# Patient Record
Sex: Male | Born: 1945 | Race: White | Hispanic: No | Marital: Married | Smoking: Former smoker
Health system: Southern US, Community
[De-identification: ages and names within clinical notes are randomized; demographics above are authoritative.]

## PROBLEM LIST (undated history)

## (undated) DIAGNOSIS — I10 Essential (primary) hypertension: Secondary | ICD-10-CM

## (undated) DIAGNOSIS — R112 Nausea with vomiting, unspecified: Secondary | ICD-10-CM

## (undated) DIAGNOSIS — E782 Mixed hyperlipidemia: Secondary | ICD-10-CM

## (undated) DIAGNOSIS — G8929 Other chronic pain: Secondary | ICD-10-CM

## (undated) DIAGNOSIS — C801 Malignant (primary) neoplasm, unspecified: Secondary | ICD-10-CM

## (undated) DIAGNOSIS — R609 Edema, unspecified: Secondary | ICD-10-CM

## (undated) DIAGNOSIS — Z9889 Other specified postprocedural states: Secondary | ICD-10-CM

## (undated) DIAGNOSIS — I5189 Other ill-defined heart diseases: Secondary | ICD-10-CM

## (undated) DIAGNOSIS — M1990 Unspecified osteoarthritis, unspecified site: Secondary | ICD-10-CM

## (undated) DIAGNOSIS — R972 Elevated prostate specific antigen [PSA]: Secondary | ICD-10-CM

## (undated) HISTORY — DX: Edema, unspecified: R60.9

## (undated) HISTORY — DX: Other chronic pain: G89.29

## (undated) HISTORY — DX: Other ill-defined heart diseases: I51.89

## (undated) HISTORY — PX: PROSTATECTOMY: SHX69

## (undated) HISTORY — PX: TOTAL HIP ARTHROPLASTY: SHX124

## (undated) HISTORY — DX: Elevated prostate specific antigen (PSA): R97.20

## (undated) HISTORY — DX: Mixed hyperlipidemia: E78.2

---

## 2011-07-05 DIAGNOSIS — N529 Male erectile dysfunction, unspecified: Secondary | ICD-10-CM | POA: Diagnosis not present

## 2011-07-05 DIAGNOSIS — C61 Malignant neoplasm of prostate: Secondary | ICD-10-CM | POA: Diagnosis not present

## 2011-07-19 DIAGNOSIS — L2089 Other atopic dermatitis: Secondary | ICD-10-CM | POA: Diagnosis not present

## 2011-07-19 DIAGNOSIS — E782 Mixed hyperlipidemia: Secondary | ICD-10-CM | POA: Diagnosis not present

## 2011-07-19 DIAGNOSIS — E119 Type 2 diabetes mellitus without complications: Secondary | ICD-10-CM | POA: Diagnosis not present

## 2011-07-19 DIAGNOSIS — I10 Essential (primary) hypertension: Secondary | ICD-10-CM | POA: Diagnosis not present

## 2011-09-06 DIAGNOSIS — R5381 Other malaise: Secondary | ICD-10-CM | POA: Diagnosis not present

## 2011-09-06 DIAGNOSIS — J309 Allergic rhinitis, unspecified: Secondary | ICD-10-CM | POA: Diagnosis not present

## 2011-09-06 DIAGNOSIS — G4710 Hypersomnia, unspecified: Secondary | ICD-10-CM | POA: Diagnosis not present

## 2011-09-06 DIAGNOSIS — G4730 Sleep apnea, unspecified: Secondary | ICD-10-CM | POA: Diagnosis not present

## 2011-09-12 DIAGNOSIS — G4730 Sleep apnea, unspecified: Secondary | ICD-10-CM | POA: Diagnosis not present

## 2011-09-12 DIAGNOSIS — G4710 Hypersomnia, unspecified: Secondary | ICD-10-CM | POA: Diagnosis not present

## 2011-09-27 DIAGNOSIS — J309 Allergic rhinitis, unspecified: Secondary | ICD-10-CM | POA: Diagnosis not present

## 2011-09-27 DIAGNOSIS — G4710 Hypersomnia, unspecified: Secondary | ICD-10-CM | POA: Diagnosis not present

## 2011-09-27 DIAGNOSIS — I10 Essential (primary) hypertension: Secondary | ICD-10-CM | POA: Diagnosis not present

## 2011-09-28 DIAGNOSIS — G4730 Sleep apnea, unspecified: Secondary | ICD-10-CM | POA: Diagnosis not present

## 2011-09-28 DIAGNOSIS — G4710 Hypersomnia, unspecified: Secondary | ICD-10-CM | POA: Diagnosis not present

## 2011-10-04 DIAGNOSIS — R972 Elevated prostate specific antigen [PSA]: Secondary | ICD-10-CM | POA: Diagnosis not present

## 2011-10-04 DIAGNOSIS — J309 Allergic rhinitis, unspecified: Secondary | ICD-10-CM | POA: Diagnosis not present

## 2011-10-04 DIAGNOSIS — G4730 Sleep apnea, unspecified: Secondary | ICD-10-CM | POA: Diagnosis not present

## 2011-10-04 DIAGNOSIS — G4710 Hypersomnia, unspecified: Secondary | ICD-10-CM | POA: Diagnosis not present

## 2011-10-04 DIAGNOSIS — N529 Male erectile dysfunction, unspecified: Secondary | ICD-10-CM | POA: Diagnosis not present

## 2011-10-04 DIAGNOSIS — R32 Unspecified urinary incontinence: Secondary | ICD-10-CM | POA: Diagnosis not present

## 2011-10-04 DIAGNOSIS — C61 Malignant neoplasm of prostate: Secondary | ICD-10-CM | POA: Diagnosis not present

## 2012-01-17 DIAGNOSIS — E119 Type 2 diabetes mellitus without complications: Secondary | ICD-10-CM | POA: Diagnosis not present

## 2012-01-17 DIAGNOSIS — Z111 Encounter for screening for respiratory tuberculosis: Secondary | ICD-10-CM | POA: Diagnosis not present

## 2012-01-17 DIAGNOSIS — E782 Mixed hyperlipidemia: Secondary | ICD-10-CM | POA: Diagnosis not present

## 2012-01-17 DIAGNOSIS — I10 Essential (primary) hypertension: Secondary | ICD-10-CM | POA: Diagnosis not present

## 2012-02-07 DIAGNOSIS — C61 Malignant neoplasm of prostate: Secondary | ICD-10-CM | POA: Diagnosis not present

## 2012-02-07 DIAGNOSIS — N529 Male erectile dysfunction, unspecified: Secondary | ICD-10-CM | POA: Diagnosis not present

## 2012-03-16 DIAGNOSIS — Z23 Encounter for immunization: Secondary | ICD-10-CM | POA: Diagnosis not present

## 2012-03-25 DIAGNOSIS — Z23 Encounter for immunization: Secondary | ICD-10-CM | POA: Diagnosis not present

## 2012-03-27 DIAGNOSIS — E291 Testicular hypofunction: Secondary | ICD-10-CM | POA: Diagnosis not present

## 2012-03-27 DIAGNOSIS — N529 Male erectile dysfunction, unspecified: Secondary | ICD-10-CM | POA: Diagnosis not present

## 2012-06-12 DIAGNOSIS — N529 Male erectile dysfunction, unspecified: Secondary | ICD-10-CM | POA: Diagnosis not present

## 2012-06-12 DIAGNOSIS — C61 Malignant neoplasm of prostate: Secondary | ICD-10-CM | POA: Diagnosis not present

## 2012-06-12 DIAGNOSIS — N393 Stress incontinence (female) (male): Secondary | ICD-10-CM | POA: Diagnosis not present

## 2012-07-24 DIAGNOSIS — E782 Mixed hyperlipidemia: Secondary | ICD-10-CM | POA: Diagnosis not present

## 2012-07-24 DIAGNOSIS — E119 Type 2 diabetes mellitus without complications: Secondary | ICD-10-CM | POA: Diagnosis not present

## 2012-07-24 DIAGNOSIS — I10 Essential (primary) hypertension: Secondary | ICD-10-CM | POA: Diagnosis not present

## 2012-08-14 DIAGNOSIS — E291 Testicular hypofunction: Secondary | ICD-10-CM | POA: Diagnosis not present

## 2012-08-14 DIAGNOSIS — C61 Malignant neoplasm of prostate: Secondary | ICD-10-CM | POA: Diagnosis not present

## 2012-08-19 DIAGNOSIS — K5730 Diverticulosis of large intestine without perforation or abscess without bleeding: Secondary | ICD-10-CM | POA: Diagnosis not present

## 2012-08-19 DIAGNOSIS — Z86010 Personal history of colonic polyps: Secondary | ICD-10-CM | POA: Diagnosis not present

## 2012-08-19 DIAGNOSIS — Z800 Family history of malignant neoplasm of digestive organs: Secondary | ICD-10-CM | POA: Diagnosis not present

## 2012-08-21 DIAGNOSIS — E782 Mixed hyperlipidemia: Secondary | ICD-10-CM | POA: Diagnosis not present

## 2012-08-21 DIAGNOSIS — E119 Type 2 diabetes mellitus without complications: Secondary | ICD-10-CM | POA: Diagnosis not present

## 2012-10-16 DIAGNOSIS — C61 Malignant neoplasm of prostate: Secondary | ICD-10-CM | POA: Diagnosis not present

## 2012-10-16 DIAGNOSIS — N529 Male erectile dysfunction, unspecified: Secondary | ICD-10-CM | POA: Diagnosis not present

## 2012-11-18 DIAGNOSIS — M5380 Other specified dorsopathies, site unspecified: Secondary | ICD-10-CM | POA: Diagnosis not present

## 2012-11-18 DIAGNOSIS — M999 Biomechanical lesion, unspecified: Secondary | ICD-10-CM | POA: Diagnosis not present

## 2012-12-11 DIAGNOSIS — E782 Mixed hyperlipidemia: Secondary | ICD-10-CM | POA: Diagnosis not present

## 2012-12-11 DIAGNOSIS — E559 Vitamin D deficiency, unspecified: Secondary | ICD-10-CM | POA: Diagnosis not present

## 2012-12-11 DIAGNOSIS — E119 Type 2 diabetes mellitus without complications: Secondary | ICD-10-CM | POA: Diagnosis not present

## 2012-12-11 DIAGNOSIS — I10 Essential (primary) hypertension: Secondary | ICD-10-CM | POA: Diagnosis not present

## 2013-01-01 DIAGNOSIS — Z1331 Encounter for screening for depression: Secondary | ICD-10-CM | POA: Diagnosis not present

## 2013-01-01 DIAGNOSIS — I10 Essential (primary) hypertension: Secondary | ICD-10-CM | POA: Diagnosis not present

## 2013-01-01 DIAGNOSIS — IMO0001 Reserved for inherently not codable concepts without codable children: Secondary | ICD-10-CM | POA: Diagnosis not present

## 2013-01-01 DIAGNOSIS — E119 Type 2 diabetes mellitus without complications: Secondary | ICD-10-CM | POA: Diagnosis not present

## 2013-01-01 DIAGNOSIS — E782 Mixed hyperlipidemia: Secondary | ICD-10-CM | POA: Diagnosis not present

## 2013-03-05 DIAGNOSIS — C61 Malignant neoplasm of prostate: Secondary | ICD-10-CM | POA: Diagnosis not present

## 2013-03-05 DIAGNOSIS — N529 Male erectile dysfunction, unspecified: Secondary | ICD-10-CM | POA: Diagnosis not present

## 2013-03-26 DIAGNOSIS — H2510 Age-related nuclear cataract, unspecified eye: Secondary | ICD-10-CM | POA: Diagnosis not present

## 2013-03-26 DIAGNOSIS — E119 Type 2 diabetes mellitus without complications: Secondary | ICD-10-CM | POA: Diagnosis not present

## 2013-03-26 DIAGNOSIS — H43819 Vitreous degeneration, unspecified eye: Secondary | ICD-10-CM | POA: Diagnosis not present

## 2013-04-07 DIAGNOSIS — Z23 Encounter for immunization: Secondary | ICD-10-CM | POA: Diagnosis not present

## 2013-07-09 DIAGNOSIS — N529 Male erectile dysfunction, unspecified: Secondary | ICD-10-CM | POA: Diagnosis not present

## 2013-07-09 DIAGNOSIS — C61 Malignant neoplasm of prostate: Secondary | ICD-10-CM | POA: Diagnosis not present

## 2013-09-24 DIAGNOSIS — M999 Biomechanical lesion, unspecified: Secondary | ICD-10-CM | POA: Diagnosis not present

## 2013-09-24 DIAGNOSIS — M5380 Other specified dorsopathies, site unspecified: Secondary | ICD-10-CM | POA: Diagnosis not present

## 2013-09-30 DIAGNOSIS — Z471 Aftercare following joint replacement surgery: Secondary | ICD-10-CM | POA: Diagnosis not present

## 2013-09-30 DIAGNOSIS — M76899 Other specified enthesopathies of unspecified lower limb, excluding foot: Secondary | ICD-10-CM | POA: Diagnosis not present

## 2013-09-30 DIAGNOSIS — Z96649 Presence of unspecified artificial hip joint: Secondary | ICD-10-CM | POA: Diagnosis not present

## 2013-11-12 DIAGNOSIS — N529 Male erectile dysfunction, unspecified: Secondary | ICD-10-CM | POA: Diagnosis not present

## 2013-11-12 DIAGNOSIS — C61 Malignant neoplasm of prostate: Secondary | ICD-10-CM | POA: Diagnosis not present

## 2013-12-28 DIAGNOSIS — Z8546 Personal history of malignant neoplasm of prostate: Secondary | ICD-10-CM | POA: Diagnosis not present

## 2013-12-28 DIAGNOSIS — Z0389 Encounter for observation for other suspected diseases and conditions ruled out: Secondary | ICD-10-CM | POA: Diagnosis not present

## 2013-12-28 DIAGNOSIS — R079 Chest pain, unspecified: Secondary | ICD-10-CM | POA: Diagnosis not present

## 2013-12-28 DIAGNOSIS — Z7982 Long term (current) use of aspirin: Secondary | ICD-10-CM | POA: Diagnosis not present

## 2013-12-28 DIAGNOSIS — E785 Hyperlipidemia, unspecified: Secondary | ICD-10-CM | POA: Diagnosis not present

## 2013-12-28 DIAGNOSIS — R110 Nausea: Secondary | ICD-10-CM | POA: Diagnosis not present

## 2013-12-28 DIAGNOSIS — E782 Mixed hyperlipidemia: Secondary | ICD-10-CM | POA: Diagnosis not present

## 2013-12-28 DIAGNOSIS — Z87891 Personal history of nicotine dependence: Secondary | ICD-10-CM | POA: Diagnosis not present

## 2013-12-28 DIAGNOSIS — R072 Precordial pain: Secondary | ICD-10-CM | POA: Diagnosis not present

## 2013-12-28 DIAGNOSIS — Z79899 Other long term (current) drug therapy: Secondary | ICD-10-CM | POA: Diagnosis not present

## 2013-12-28 DIAGNOSIS — I10 Essential (primary) hypertension: Secondary | ICD-10-CM | POA: Diagnosis not present

## 2013-12-28 DIAGNOSIS — E119 Type 2 diabetes mellitus without complications: Secondary | ICD-10-CM | POA: Diagnosis not present

## 2013-12-28 DIAGNOSIS — I200 Unstable angina: Secondary | ICD-10-CM | POA: Diagnosis not present

## 2013-12-29 DIAGNOSIS — R0789 Other chest pain: Secondary | ICD-10-CM | POA: Diagnosis not present

## 2013-12-29 DIAGNOSIS — Z0389 Encounter for observation for other suspected diseases and conditions ruled out: Secondary | ICD-10-CM | POA: Diagnosis not present

## 2014-01-14 DIAGNOSIS — I10 Essential (primary) hypertension: Secondary | ICD-10-CM | POA: Diagnosis not present

## 2014-01-14 DIAGNOSIS — R5383 Other fatigue: Secondary | ICD-10-CM | POA: Diagnosis not present

## 2014-01-14 DIAGNOSIS — Z139 Encounter for screening, unspecified: Secondary | ICD-10-CM | POA: Diagnosis not present

## 2014-01-14 DIAGNOSIS — E782 Mixed hyperlipidemia: Secondary | ICD-10-CM | POA: Diagnosis not present

## 2014-01-14 DIAGNOSIS — M546 Pain in thoracic spine: Secondary | ICD-10-CM | POA: Diagnosis not present

## 2014-01-14 DIAGNOSIS — R5381 Other malaise: Secondary | ICD-10-CM | POA: Diagnosis not present

## 2014-01-14 DIAGNOSIS — IMO0002 Reserved for concepts with insufficient information to code with codable children: Secondary | ICD-10-CM | POA: Diagnosis not present

## 2014-01-14 DIAGNOSIS — E119 Type 2 diabetes mellitus without complications: Secondary | ICD-10-CM | POA: Diagnosis not present

## 2014-01-28 DIAGNOSIS — T8489XA Other specified complication of internal orthopedic prosthetic devices, implants and grafts, initial encounter: Secondary | ICD-10-CM | POA: Diagnosis not present

## 2014-02-04 DIAGNOSIS — M25559 Pain in unspecified hip: Secondary | ICD-10-CM | POA: Diagnosis not present

## 2014-03-03 DIAGNOSIS — Z23 Encounter for immunization: Secondary | ICD-10-CM | POA: Diagnosis not present

## 2014-03-16 DIAGNOSIS — Z23 Encounter for immunization: Secondary | ICD-10-CM | POA: Diagnosis not present

## 2014-03-25 DIAGNOSIS — M25552 Pain in left hip: Secondary | ICD-10-CM | POA: Diagnosis not present

## 2014-04-01 DIAGNOSIS — G4733 Obstructive sleep apnea (adult) (pediatric): Secondary | ICD-10-CM | POA: Diagnosis not present

## 2014-04-01 DIAGNOSIS — T562X1A Toxic effect of chromium and its compounds, accidental (unintentional), initial encounter: Secondary | ICD-10-CM | POA: Diagnosis not present

## 2014-04-01 DIAGNOSIS — Y793 Surgical instruments, materials and orthopedic devices (including sutures) associated with adverse incidents: Secondary | ICD-10-CM | POA: Diagnosis not present

## 2014-04-01 DIAGNOSIS — T8484XA Pain due to internal orthopedic prosthetic devices, implants and grafts, initial encounter: Secondary | ICD-10-CM | POA: Diagnosis not present

## 2014-04-01 DIAGNOSIS — Z8546 Personal history of malignant neoplasm of prostate: Secondary | ICD-10-CM | POA: Diagnosis not present

## 2014-04-01 DIAGNOSIS — Z96642 Presence of left artificial hip joint: Secondary | ICD-10-CM | POA: Diagnosis not present

## 2014-04-01 DIAGNOSIS — I10 Essential (primary) hypertension: Secondary | ICD-10-CM | POA: Diagnosis not present

## 2014-04-01 DIAGNOSIS — T8489XA Other specified complication of internal orthopedic prosthetic devices, implants and grafts, initial encounter: Secondary | ICD-10-CM | POA: Diagnosis not present

## 2014-04-06 DIAGNOSIS — T562X1A Toxic effect of chromium and its compounds, accidental (unintentional), initial encounter: Secondary | ICD-10-CM | POA: Diagnosis not present

## 2014-04-06 DIAGNOSIS — T84091A Other mechanical complication of internal left hip prosthesis, initial encounter: Secondary | ICD-10-CM | POA: Diagnosis not present

## 2014-04-06 DIAGNOSIS — T8484XA Pain due to internal orthopedic prosthetic devices, implants and grafts, initial encounter: Secondary | ICD-10-CM | POA: Diagnosis not present

## 2014-04-06 DIAGNOSIS — I10 Essential (primary) hypertension: Secondary | ICD-10-CM | POA: Diagnosis present

## 2014-04-06 DIAGNOSIS — M25552 Pain in left hip: Secondary | ICD-10-CM | POA: Diagnosis not present

## 2014-04-06 DIAGNOSIS — Z8546 Personal history of malignant neoplasm of prostate: Secondary | ICD-10-CM | POA: Diagnosis not present

## 2014-04-06 DIAGNOSIS — T562X4A Toxic effect of chromium and its compounds, undetermined, initial encounter: Secondary | ICD-10-CM | POA: Diagnosis not present

## 2014-04-06 DIAGNOSIS — Z96642 Presence of left artificial hip joint: Secondary | ICD-10-CM | POA: Diagnosis not present

## 2014-04-06 DIAGNOSIS — E119 Type 2 diabetes mellitus without complications: Secondary | ICD-10-CM | POA: Diagnosis present

## 2014-04-06 DIAGNOSIS — G8918 Other acute postprocedural pain: Secondary | ICD-10-CM | POA: Diagnosis not present

## 2014-04-06 DIAGNOSIS — Z87891 Personal history of nicotine dependence: Secondary | ICD-10-CM | POA: Diagnosis not present

## 2014-04-06 DIAGNOSIS — E785 Hyperlipidemia, unspecified: Secondary | ICD-10-CM | POA: Diagnosis present

## 2014-04-06 DIAGNOSIS — T8489XA Other specified complication of internal orthopedic prosthetic devices, implants and grafts, initial encounter: Secondary | ICD-10-CM | POA: Diagnosis present

## 2014-04-06 DIAGNOSIS — G4733 Obstructive sleep apnea (adult) (pediatric): Secondary | ICD-10-CM | POA: Diagnosis present

## 2014-04-10 DIAGNOSIS — Z96642 Presence of left artificial hip joint: Secondary | ICD-10-CM | POA: Diagnosis not present

## 2014-04-10 DIAGNOSIS — Z471 Aftercare following joint replacement surgery: Secondary | ICD-10-CM | POA: Diagnosis not present

## 2014-04-10 DIAGNOSIS — Z7901 Long term (current) use of anticoagulants: Secondary | ICD-10-CM | POA: Diagnosis not present

## 2014-04-10 DIAGNOSIS — Z5181 Encounter for therapeutic drug level monitoring: Secondary | ICD-10-CM | POA: Diagnosis not present

## 2014-04-11 DIAGNOSIS — Z5181 Encounter for therapeutic drug level monitoring: Secondary | ICD-10-CM | POA: Diagnosis not present

## 2014-04-11 DIAGNOSIS — Z96642 Presence of left artificial hip joint: Secondary | ICD-10-CM | POA: Diagnosis not present

## 2014-04-11 DIAGNOSIS — Z7901 Long term (current) use of anticoagulants: Secondary | ICD-10-CM | POA: Diagnosis not present

## 2014-04-11 DIAGNOSIS — Z471 Aftercare following joint replacement surgery: Secondary | ICD-10-CM | POA: Diagnosis not present

## 2014-04-13 DIAGNOSIS — Z5181 Encounter for therapeutic drug level monitoring: Secondary | ICD-10-CM | POA: Diagnosis not present

## 2014-04-13 DIAGNOSIS — Z7901 Long term (current) use of anticoagulants: Secondary | ICD-10-CM | POA: Diagnosis not present

## 2014-04-13 DIAGNOSIS — Z471 Aftercare following joint replacement surgery: Secondary | ICD-10-CM | POA: Diagnosis not present

## 2014-04-13 DIAGNOSIS — Z96642 Presence of left artificial hip joint: Secondary | ICD-10-CM | POA: Diagnosis not present

## 2014-04-14 DIAGNOSIS — Z471 Aftercare following joint replacement surgery: Secondary | ICD-10-CM | POA: Diagnosis not present

## 2014-04-14 DIAGNOSIS — Z96642 Presence of left artificial hip joint: Secondary | ICD-10-CM | POA: Diagnosis not present

## 2014-04-14 DIAGNOSIS — Z7901 Long term (current) use of anticoagulants: Secondary | ICD-10-CM | POA: Diagnosis not present

## 2014-04-14 DIAGNOSIS — Z5181 Encounter for therapeutic drug level monitoring: Secondary | ICD-10-CM | POA: Diagnosis not present

## 2014-04-18 DIAGNOSIS — Z7901 Long term (current) use of anticoagulants: Secondary | ICD-10-CM | POA: Diagnosis not present

## 2014-04-18 DIAGNOSIS — Z471 Aftercare following joint replacement surgery: Secondary | ICD-10-CM | POA: Diagnosis not present

## 2014-04-18 DIAGNOSIS — Z5181 Encounter for therapeutic drug level monitoring: Secondary | ICD-10-CM | POA: Diagnosis not present

## 2014-04-18 DIAGNOSIS — Z96642 Presence of left artificial hip joint: Secondary | ICD-10-CM | POA: Diagnosis not present

## 2014-04-19 DIAGNOSIS — H3531 Nonexudative age-related macular degeneration: Secondary | ICD-10-CM | POA: Diagnosis not present

## 2014-04-19 DIAGNOSIS — E119 Type 2 diabetes mellitus without complications: Secondary | ICD-10-CM | POA: Diagnosis not present

## 2014-04-19 DIAGNOSIS — H2511 Age-related nuclear cataract, right eye: Secondary | ICD-10-CM | POA: Diagnosis not present

## 2014-04-19 DIAGNOSIS — H2513 Age-related nuclear cataract, bilateral: Secondary | ICD-10-CM | POA: Diagnosis not present

## 2014-04-19 DIAGNOSIS — H43812 Vitreous degeneration, left eye: Secondary | ICD-10-CM | POA: Diagnosis not present

## 2014-04-21 DIAGNOSIS — Z96642 Presence of left artificial hip joint: Secondary | ICD-10-CM | POA: Diagnosis not present

## 2014-04-21 DIAGNOSIS — Z471 Aftercare following joint replacement surgery: Secondary | ICD-10-CM | POA: Diagnosis not present

## 2014-04-21 DIAGNOSIS — Z7901 Long term (current) use of anticoagulants: Secondary | ICD-10-CM | POA: Diagnosis not present

## 2014-04-21 DIAGNOSIS — Z5181 Encounter for therapeutic drug level monitoring: Secondary | ICD-10-CM | POA: Diagnosis not present

## 2014-05-13 DIAGNOSIS — N529 Male erectile dysfunction, unspecified: Secondary | ICD-10-CM | POA: Diagnosis not present

## 2014-05-13 DIAGNOSIS — C61 Malignant neoplasm of prostate: Secondary | ICD-10-CM | POA: Diagnosis not present

## 2014-06-03 DIAGNOSIS — I10 Essential (primary) hypertension: Secondary | ICD-10-CM | POA: Diagnosis not present

## 2014-06-03 DIAGNOSIS — E782 Mixed hyperlipidemia: Secondary | ICD-10-CM | POA: Diagnosis not present

## 2014-06-03 DIAGNOSIS — M791 Myalgia: Secondary | ICD-10-CM | POA: Diagnosis not present

## 2014-06-03 DIAGNOSIS — E119 Type 2 diabetes mellitus without complications: Secondary | ICD-10-CM | POA: Diagnosis not present

## 2014-10-14 DIAGNOSIS — S338XXA Sprain of other parts of lumbar spine and pelvis, initial encounter: Secondary | ICD-10-CM | POA: Diagnosis not present

## 2014-10-14 DIAGNOSIS — M9903 Segmental and somatic dysfunction of lumbar region: Secondary | ICD-10-CM | POA: Diagnosis not present

## 2014-10-21 DIAGNOSIS — S338XXA Sprain of other parts of lumbar spine and pelvis, initial encounter: Secondary | ICD-10-CM | POA: Diagnosis not present

## 2014-10-21 DIAGNOSIS — M9903 Segmental and somatic dysfunction of lumbar region: Secondary | ICD-10-CM | POA: Diagnosis not present

## 2014-10-28 DIAGNOSIS — S338XXA Sprain of other parts of lumbar spine and pelvis, initial encounter: Secondary | ICD-10-CM | POA: Diagnosis not present

## 2014-10-28 DIAGNOSIS — M9903 Segmental and somatic dysfunction of lumbar region: Secondary | ICD-10-CM | POA: Diagnosis not present

## 2014-11-04 DIAGNOSIS — M9903 Segmental and somatic dysfunction of lumbar region: Secondary | ICD-10-CM | POA: Diagnosis not present

## 2014-11-04 DIAGNOSIS — S338XXA Sprain of other parts of lumbar spine and pelvis, initial encounter: Secondary | ICD-10-CM | POA: Diagnosis not present

## 2014-11-18 DIAGNOSIS — S338XXA Sprain of other parts of lumbar spine and pelvis, initial encounter: Secondary | ICD-10-CM | POA: Diagnosis not present

## 2014-11-18 DIAGNOSIS — M9903 Segmental and somatic dysfunction of lumbar region: Secondary | ICD-10-CM | POA: Diagnosis not present

## 2014-11-25 DIAGNOSIS — M9903 Segmental and somatic dysfunction of lumbar region: Secondary | ICD-10-CM | POA: Diagnosis not present

## 2014-11-25 DIAGNOSIS — S338XXA Sprain of other parts of lumbar spine and pelvis, initial encounter: Secondary | ICD-10-CM | POA: Diagnosis not present

## 2014-12-02 DIAGNOSIS — S86012A Strain of left Achilles tendon, initial encounter: Secondary | ICD-10-CM | POA: Diagnosis not present

## 2014-12-02 DIAGNOSIS — C61 Malignant neoplasm of prostate: Secondary | ICD-10-CM | POA: Diagnosis not present

## 2014-12-02 DIAGNOSIS — N529 Male erectile dysfunction, unspecified: Secondary | ICD-10-CM | POA: Diagnosis not present

## 2015-04-20 DIAGNOSIS — E119 Type 2 diabetes mellitus without complications: Secondary | ICD-10-CM | POA: Diagnosis not present

## 2015-04-20 DIAGNOSIS — H2511 Age-related nuclear cataract, right eye: Secondary | ICD-10-CM | POA: Diagnosis not present

## 2015-04-20 DIAGNOSIS — H33301 Unspecified retinal break, right eye: Secondary | ICD-10-CM | POA: Diagnosis not present

## 2015-04-20 DIAGNOSIS — H43812 Vitreous degeneration, left eye: Secondary | ICD-10-CM | POA: Diagnosis not present

## 2015-04-27 DIAGNOSIS — H33301 Unspecified retinal break, right eye: Secondary | ICD-10-CM | POA: Diagnosis not present

## 2015-05-13 DIAGNOSIS — Z23 Encounter for immunization: Secondary | ICD-10-CM | POA: Diagnosis not present

## 2015-06-09 DIAGNOSIS — N393 Stress incontinence (female) (male): Secondary | ICD-10-CM | POA: Diagnosis not present

## 2015-06-09 DIAGNOSIS — C61 Malignant neoplasm of prostate: Secondary | ICD-10-CM | POA: Diagnosis not present

## 2015-06-09 DIAGNOSIS — N529 Male erectile dysfunction, unspecified: Secondary | ICD-10-CM | POA: Diagnosis not present

## 2015-08-03 DIAGNOSIS — N3090 Cystitis, unspecified without hematuria: Secondary | ICD-10-CM | POA: Diagnosis not present

## 2015-08-03 DIAGNOSIS — H2513 Age-related nuclear cataract, bilateral: Secondary | ICD-10-CM | POA: Diagnosis not present

## 2015-08-03 DIAGNOSIS — H43812 Vitreous degeneration, left eye: Secondary | ICD-10-CM | POA: Diagnosis not present

## 2015-08-03 DIAGNOSIS — E119 Type 2 diabetes mellitus without complications: Secondary | ICD-10-CM | POA: Diagnosis not present

## 2015-08-03 DIAGNOSIS — H33301 Unspecified retinal break, right eye: Secondary | ICD-10-CM | POA: Diagnosis not present

## 2015-11-10 DIAGNOSIS — Z13228 Encounter for screening for other metabolic disorders: Secondary | ICD-10-CM | POA: Diagnosis not present

## 2015-11-10 DIAGNOSIS — Z79899 Other long term (current) drug therapy: Secondary | ICD-10-CM | POA: Diagnosis not present

## 2015-11-10 DIAGNOSIS — I10 Essential (primary) hypertension: Secondary | ICD-10-CM | POA: Diagnosis not present

## 2015-11-10 DIAGNOSIS — Z1329 Encounter for screening for other suspected endocrine disorder: Secondary | ICD-10-CM | POA: Diagnosis not present

## 2015-12-08 DIAGNOSIS — N393 Stress incontinence (female) (male): Secondary | ICD-10-CM | POA: Diagnosis not present

## 2015-12-08 DIAGNOSIS — R32 Unspecified urinary incontinence: Secondary | ICD-10-CM | POA: Diagnosis not present

## 2015-12-08 DIAGNOSIS — C61 Malignant neoplasm of prostate: Secondary | ICD-10-CM | POA: Diagnosis not present

## 2016-03-13 DIAGNOSIS — Z23 Encounter for immunization: Secondary | ICD-10-CM | POA: Diagnosis not present

## 2016-07-26 DIAGNOSIS — I10 Essential (primary) hypertension: Secondary | ICD-10-CM | POA: Diagnosis not present

## 2016-07-26 DIAGNOSIS — Z0000 Encounter for general adult medical examination without abnormal findings: Secondary | ICD-10-CM | POA: Diagnosis not present

## 2016-07-26 DIAGNOSIS — E119 Type 2 diabetes mellitus without complications: Secondary | ICD-10-CM | POA: Diagnosis not present

## 2016-07-26 DIAGNOSIS — E782 Mixed hyperlipidemia: Secondary | ICD-10-CM | POA: Diagnosis not present

## 2016-07-26 DIAGNOSIS — Z1329 Encounter for screening for other suspected endocrine disorder: Secondary | ICD-10-CM | POA: Diagnosis not present

## 2016-08-02 DIAGNOSIS — N393 Stress incontinence (female) (male): Secondary | ICD-10-CM | POA: Diagnosis not present

## 2016-08-02 DIAGNOSIS — C61 Malignant neoplasm of prostate: Secondary | ICD-10-CM | POA: Diagnosis not present

## 2016-08-09 DIAGNOSIS — H33301 Unspecified retinal break, right eye: Secondary | ICD-10-CM | POA: Diagnosis not present

## 2016-08-09 DIAGNOSIS — E119 Type 2 diabetes mellitus without complications: Secondary | ICD-10-CM | POA: Diagnosis not present

## 2016-08-09 DIAGNOSIS — H43812 Vitreous degeneration, left eye: Secondary | ICD-10-CM | POA: Diagnosis not present

## 2016-08-09 DIAGNOSIS — H2513 Age-related nuclear cataract, bilateral: Secondary | ICD-10-CM | POA: Diagnosis not present

## 2017-01-17 DIAGNOSIS — R0602 Shortness of breath: Secondary | ICD-10-CM | POA: Diagnosis not present

## 2017-01-17 DIAGNOSIS — E119 Type 2 diabetes mellitus without complications: Secondary | ICD-10-CM | POA: Diagnosis not present

## 2017-01-17 DIAGNOSIS — R079 Chest pain, unspecified: Secondary | ICD-10-CM | POA: Diagnosis not present

## 2017-01-17 DIAGNOSIS — I200 Unstable angina: Secondary | ICD-10-CM | POA: Diagnosis not present

## 2017-01-17 DIAGNOSIS — E875 Hyperkalemia: Secondary | ICD-10-CM | POA: Diagnosis not present

## 2017-01-17 DIAGNOSIS — E7800 Pure hypercholesterolemia, unspecified: Secondary | ICD-10-CM | POA: Diagnosis not present

## 2017-01-17 DIAGNOSIS — I10 Essential (primary) hypertension: Secondary | ICD-10-CM | POA: Diagnosis not present

## 2017-01-18 DIAGNOSIS — R079 Chest pain, unspecified: Secondary | ICD-10-CM | POA: Diagnosis not present

## 2017-01-18 DIAGNOSIS — E875 Hyperkalemia: Secondary | ICD-10-CM | POA: Diagnosis not present

## 2017-01-18 DIAGNOSIS — I10 Essential (primary) hypertension: Secondary | ICD-10-CM | POA: Diagnosis not present

## 2017-01-31 DIAGNOSIS — C61 Malignant neoplasm of prostate: Secondary | ICD-10-CM | POA: Diagnosis not present

## 2017-01-31 DIAGNOSIS — N486 Induration penis plastica: Secondary | ICD-10-CM | POA: Diagnosis not present

## 2017-01-31 DIAGNOSIS — N529 Male erectile dysfunction, unspecified: Secondary | ICD-10-CM | POA: Diagnosis not present

## 2017-03-12 DIAGNOSIS — Z23 Encounter for immunization: Secondary | ICD-10-CM | POA: Diagnosis not present

## 2017-06-06 DIAGNOSIS — G5790 Unspecified mononeuropathy of unspecified lower limb: Secondary | ICD-10-CM | POA: Diagnosis not present

## 2017-06-06 DIAGNOSIS — Z1331 Encounter for screening for depression: Secondary | ICD-10-CM | POA: Diagnosis not present

## 2017-06-06 DIAGNOSIS — Z139 Encounter for screening, unspecified: Secondary | ICD-10-CM | POA: Diagnosis not present

## 2017-06-06 DIAGNOSIS — Z0000 Encounter for general adult medical examination without abnormal findings: Secondary | ICD-10-CM | POA: Diagnosis not present

## 2017-06-06 DIAGNOSIS — E782 Mixed hyperlipidemia: Secondary | ICD-10-CM | POA: Diagnosis not present

## 2017-06-06 DIAGNOSIS — I10 Essential (primary) hypertension: Secondary | ICD-10-CM | POA: Diagnosis not present

## 2017-06-06 DIAGNOSIS — E119 Type 2 diabetes mellitus without complications: Secondary | ICD-10-CM | POA: Diagnosis not present

## 2017-07-31 DIAGNOSIS — C61 Malignant neoplasm of prostate: Secondary | ICD-10-CM | POA: Diagnosis not present

## 2017-07-31 DIAGNOSIS — N393 Stress incontinence (female) (male): Secondary | ICD-10-CM | POA: Diagnosis not present

## 2017-07-31 DIAGNOSIS — N529 Male erectile dysfunction, unspecified: Secondary | ICD-10-CM | POA: Diagnosis not present

## 2017-08-05 DIAGNOSIS — S338XXA Sprain of other parts of lumbar spine and pelvis, initial encounter: Secondary | ICD-10-CM | POA: Diagnosis not present

## 2017-08-05 DIAGNOSIS — M9903 Segmental and somatic dysfunction of lumbar region: Secondary | ICD-10-CM | POA: Diagnosis not present

## 2017-08-06 DIAGNOSIS — M9903 Segmental and somatic dysfunction of lumbar region: Secondary | ICD-10-CM | POA: Diagnosis not present

## 2017-08-06 DIAGNOSIS — S338XXA Sprain of other parts of lumbar spine and pelvis, initial encounter: Secondary | ICD-10-CM | POA: Diagnosis not present

## 2017-08-08 DIAGNOSIS — M9903 Segmental and somatic dysfunction of lumbar region: Secondary | ICD-10-CM | POA: Diagnosis not present

## 2017-08-08 DIAGNOSIS — S338XXA Sprain of other parts of lumbar spine and pelvis, initial encounter: Secondary | ICD-10-CM | POA: Diagnosis not present

## 2017-08-13 DIAGNOSIS — S338XXA Sprain of other parts of lumbar spine and pelvis, initial encounter: Secondary | ICD-10-CM | POA: Diagnosis not present

## 2017-08-13 DIAGNOSIS — M9903 Segmental and somatic dysfunction of lumbar region: Secondary | ICD-10-CM | POA: Diagnosis not present

## 2017-08-14 DIAGNOSIS — E119 Type 2 diabetes mellitus without complications: Secondary | ICD-10-CM | POA: Diagnosis not present

## 2017-08-14 DIAGNOSIS — H43812 Vitreous degeneration, left eye: Secondary | ICD-10-CM | POA: Diagnosis not present

## 2017-08-14 DIAGNOSIS — H2513 Age-related nuclear cataract, bilateral: Secondary | ICD-10-CM | POA: Diagnosis not present

## 2017-08-14 DIAGNOSIS — H33301 Unspecified retinal break, right eye: Secondary | ICD-10-CM | POA: Diagnosis not present

## 2017-08-15 DIAGNOSIS — S338XXA Sprain of other parts of lumbar spine and pelvis, initial encounter: Secondary | ICD-10-CM | POA: Diagnosis not present

## 2017-08-15 DIAGNOSIS — M9903 Segmental and somatic dysfunction of lumbar region: Secondary | ICD-10-CM | POA: Diagnosis not present

## 2017-08-22 DIAGNOSIS — S338XXA Sprain of other parts of lumbar spine and pelvis, initial encounter: Secondary | ICD-10-CM | POA: Diagnosis not present

## 2017-08-22 DIAGNOSIS — M9903 Segmental and somatic dysfunction of lumbar region: Secondary | ICD-10-CM | POA: Diagnosis not present

## 2017-08-29 DIAGNOSIS — S338XXA Sprain of other parts of lumbar spine and pelvis, initial encounter: Secondary | ICD-10-CM | POA: Diagnosis not present

## 2017-08-29 DIAGNOSIS — M9903 Segmental and somatic dysfunction of lumbar region: Secondary | ICD-10-CM | POA: Diagnosis not present

## 2017-09-12 DIAGNOSIS — Z1211 Encounter for screening for malignant neoplasm of colon: Secondary | ICD-10-CM | POA: Diagnosis not present

## 2017-09-12 DIAGNOSIS — Z86010 Personal history of colonic polyps: Secondary | ICD-10-CM | POA: Diagnosis not present

## 2017-09-12 DIAGNOSIS — D122 Benign neoplasm of ascending colon: Secondary | ICD-10-CM | POA: Diagnosis not present

## 2017-09-12 DIAGNOSIS — D120 Benign neoplasm of cecum: Secondary | ICD-10-CM | POA: Diagnosis not present

## 2017-09-12 DIAGNOSIS — K635 Polyp of colon: Secondary | ICD-10-CM | POA: Diagnosis not present

## 2017-09-12 DIAGNOSIS — Z800 Family history of malignant neoplasm of digestive organs: Secondary | ICD-10-CM | POA: Diagnosis not present

## 2017-11-24 DIAGNOSIS — R0789 Other chest pain: Secondary | ICD-10-CM | POA: Diagnosis not present

## 2017-11-24 DIAGNOSIS — I10 Essential (primary) hypertension: Secondary | ICD-10-CM | POA: Diagnosis not present

## 2017-11-24 DIAGNOSIS — Z6830 Body mass index (BMI) 30.0-30.9, adult: Secondary | ICD-10-CM | POA: Diagnosis not present

## 2017-12-12 DIAGNOSIS — Z1331 Encounter for screening for depression: Secondary | ICD-10-CM | POA: Diagnosis not present

## 2017-12-12 DIAGNOSIS — E782 Mixed hyperlipidemia: Secondary | ICD-10-CM | POA: Diagnosis not present

## 2017-12-12 DIAGNOSIS — E119 Type 2 diabetes mellitus without complications: Secondary | ICD-10-CM | POA: Diagnosis not present

## 2017-12-12 DIAGNOSIS — I10 Essential (primary) hypertension: Secondary | ICD-10-CM | POA: Diagnosis not present

## 2017-12-12 DIAGNOSIS — Z9181 History of falling: Secondary | ICD-10-CM | POA: Diagnosis not present

## 2018-01-23 ENCOUNTER — Other Ambulatory Visit: Payer: Self-pay

## 2018-02-05 DIAGNOSIS — N529 Male erectile dysfunction, unspecified: Secondary | ICD-10-CM | POA: Diagnosis not present

## 2018-02-05 DIAGNOSIS — C61 Malignant neoplasm of prostate: Secondary | ICD-10-CM | POA: Diagnosis not present

## 2018-02-05 DIAGNOSIS — N393 Stress incontinence (female) (male): Secondary | ICD-10-CM | POA: Diagnosis not present

## 2018-03-18 DIAGNOSIS — Z23 Encounter for immunization: Secondary | ICD-10-CM | POA: Diagnosis not present

## 2018-03-30 DIAGNOSIS — M9903 Segmental and somatic dysfunction of lumbar region: Secondary | ICD-10-CM | POA: Diagnosis not present

## 2018-03-30 DIAGNOSIS — M5126 Other intervertebral disc displacement, lumbar region: Secondary | ICD-10-CM | POA: Diagnosis not present

## 2018-05-14 ENCOUNTER — Other Ambulatory Visit: Payer: Self-pay

## 2018-05-28 DIAGNOSIS — I10 Essential (primary) hypertension: Secondary | ICD-10-CM | POA: Diagnosis not present

## 2018-05-28 DIAGNOSIS — E782 Mixed hyperlipidemia: Secondary | ICD-10-CM | POA: Diagnosis not present

## 2018-05-28 DIAGNOSIS — E119 Type 2 diabetes mellitus without complications: Secondary | ICD-10-CM | POA: Diagnosis not present

## 2018-06-12 DIAGNOSIS — E782 Mixed hyperlipidemia: Secondary | ICD-10-CM | POA: Diagnosis not present

## 2018-06-12 DIAGNOSIS — Z0000 Encounter for general adult medical examination without abnormal findings: Secondary | ICD-10-CM | POA: Diagnosis not present

## 2018-06-12 DIAGNOSIS — E1140 Type 2 diabetes mellitus with diabetic neuropathy, unspecified: Secondary | ICD-10-CM | POA: Diagnosis not present

## 2018-06-12 DIAGNOSIS — Z6830 Body mass index (BMI) 30.0-30.9, adult: Secondary | ICD-10-CM | POA: Diagnosis not present

## 2018-06-12 DIAGNOSIS — Z1331 Encounter for screening for depression: Secondary | ICD-10-CM | POA: Diagnosis not present

## 2018-06-12 DIAGNOSIS — I10 Essential (primary) hypertension: Secondary | ICD-10-CM | POA: Diagnosis not present

## 2018-08-13 DIAGNOSIS — H43811 Vitreous degeneration, right eye: Secondary | ICD-10-CM | POA: Diagnosis not present

## 2018-08-13 DIAGNOSIS — H33301 Unspecified retinal break, right eye: Secondary | ICD-10-CM | POA: Diagnosis not present

## 2018-08-13 DIAGNOSIS — H353131 Nonexudative age-related macular degeneration, bilateral, early dry stage: Secondary | ICD-10-CM | POA: Diagnosis not present

## 2018-08-13 DIAGNOSIS — H43812 Vitreous degeneration, left eye: Secondary | ICD-10-CM | POA: Diagnosis not present

## 2018-08-13 DIAGNOSIS — E119 Type 2 diabetes mellitus without complications: Secondary | ICD-10-CM | POA: Diagnosis not present

## 2018-08-25 DIAGNOSIS — N486 Induration penis plastica: Secondary | ICD-10-CM | POA: Diagnosis not present

## 2018-08-25 DIAGNOSIS — C61 Malignant neoplasm of prostate: Secondary | ICD-10-CM | POA: Diagnosis not present

## 2018-09-22 DIAGNOSIS — E782 Mixed hyperlipidemia: Secondary | ICD-10-CM | POA: Diagnosis not present

## 2018-09-22 DIAGNOSIS — I10 Essential (primary) hypertension: Secondary | ICD-10-CM | POA: Diagnosis not present

## 2018-09-22 DIAGNOSIS — E119 Type 2 diabetes mellitus without complications: Secondary | ICD-10-CM | POA: Diagnosis not present

## 2018-09-23 DIAGNOSIS — E1140 Type 2 diabetes mellitus with diabetic neuropathy, unspecified: Secondary | ICD-10-CM | POA: Diagnosis not present

## 2018-09-23 DIAGNOSIS — Z7189 Other specified counseling: Secondary | ICD-10-CM | POA: Diagnosis not present

## 2018-10-22 DIAGNOSIS — I10 Essential (primary) hypertension: Secondary | ICD-10-CM | POA: Diagnosis not present

## 2018-10-22 DIAGNOSIS — E119 Type 2 diabetes mellitus without complications: Secondary | ICD-10-CM | POA: Diagnosis not present

## 2018-10-22 DIAGNOSIS — E1140 Type 2 diabetes mellitus with diabetic neuropathy, unspecified: Secondary | ICD-10-CM | POA: Diagnosis not present

## 2018-10-22 DIAGNOSIS — E782 Mixed hyperlipidemia: Secondary | ICD-10-CM | POA: Diagnosis not present

## 2018-11-20 DIAGNOSIS — E1140 Type 2 diabetes mellitus with diabetic neuropathy, unspecified: Secondary | ICD-10-CM | POA: Diagnosis not present

## 2018-11-20 DIAGNOSIS — I10 Essential (primary) hypertension: Secondary | ICD-10-CM | POA: Diagnosis not present

## 2018-11-20 DIAGNOSIS — E119 Type 2 diabetes mellitus without complications: Secondary | ICD-10-CM | POA: Diagnosis not present

## 2018-11-20 DIAGNOSIS — E782 Mixed hyperlipidemia: Secondary | ICD-10-CM | POA: Diagnosis not present

## 2019-02-22 DIAGNOSIS — R202 Paresthesia of skin: Secondary | ICD-10-CM | POA: Diagnosis not present

## 2019-02-22 DIAGNOSIS — R200 Anesthesia of skin: Secondary | ICD-10-CM | POA: Diagnosis not present

## 2019-02-22 DIAGNOSIS — Z23 Encounter for immunization: Secondary | ICD-10-CM | POA: Diagnosis not present

## 2019-02-22 DIAGNOSIS — Z139 Encounter for screening, unspecified: Secondary | ICD-10-CM | POA: Diagnosis not present

## 2019-02-22 DIAGNOSIS — Z6829 Body mass index (BMI) 29.0-29.9, adult: Secondary | ICD-10-CM | POA: Diagnosis not present

## 2019-03-04 DIAGNOSIS — C61 Malignant neoplasm of prostate: Secondary | ICD-10-CM | POA: Diagnosis not present

## 2019-03-04 DIAGNOSIS — N529 Male erectile dysfunction, unspecified: Secondary | ICD-10-CM | POA: Diagnosis not present

## 2019-03-04 DIAGNOSIS — M549 Dorsalgia, unspecified: Secondary | ICD-10-CM | POA: Diagnosis not present

## 2019-03-04 DIAGNOSIS — R109 Unspecified abdominal pain: Secondary | ICD-10-CM | POA: Diagnosis not present

## 2019-03-04 DIAGNOSIS — Z87442 Personal history of urinary calculi: Secondary | ICD-10-CM | POA: Diagnosis not present

## 2019-03-04 DIAGNOSIS — N486 Induration penis plastica: Secondary | ICD-10-CM | POA: Diagnosis not present

## 2019-04-06 ENCOUNTER — Ambulatory Visit (INDEPENDENT_AMBULATORY_CARE_PROVIDER_SITE_OTHER): Payer: Medicare Other | Admitting: Neurology

## 2019-04-06 ENCOUNTER — Encounter: Payer: Self-pay | Admitting: Neurology

## 2019-04-06 ENCOUNTER — Other Ambulatory Visit: Payer: Self-pay

## 2019-04-06 VITALS — BP 133/78 | HR 78 | Temp 97.4°F | Ht 73.0 in | Wt 218.0 lb

## 2019-04-06 DIAGNOSIS — G629 Polyneuropathy, unspecified: Secondary | ICD-10-CM | POA: Diagnosis not present

## 2019-04-06 DIAGNOSIS — R202 Paresthesia of skin: Secondary | ICD-10-CM

## 2019-04-06 DIAGNOSIS — G5793 Unspecified mononeuropathy of bilateral lower limbs: Secondary | ICD-10-CM | POA: Diagnosis not present

## 2019-04-06 DIAGNOSIS — R200 Anesthesia of skin: Secondary | ICD-10-CM | POA: Diagnosis not present

## 2019-04-06 DIAGNOSIS — G6289 Other specified polyneuropathies: Secondary | ICD-10-CM | POA: Diagnosis not present

## 2019-04-06 NOTE — Patient Instructions (Signed)
You may have a condition called peripheral neuropathy, i. e. nerve damage. There is no specific treatment for most neuropathies. The most common cause for neuropathy is diabetes in this [place], in which case, tight glucose control is key. Other causes include thyroid disease, and some vitamin deficiencies. Certain medications such as chemotherapy agents and other chemicals or toxins including alcohol can cause neuropathy. There are some genetic conditions or hereditary neuropathies. Typically patients will report a family history of neuropathy in those conditions. There are cases associated with cancers and autoimmune conditions. Most neuropathies are progressive unless a root cause can be found and treated. For most neuropathies there is no actual cure or reversing of symptoms. Painful neuropathy can be difficult to treat symptomatically, but there are some medications available to ease the symptoms. Electrophysiologic testing with nerve conduction velocity studies and EMG (muscle testing) do not always pick up neuropathies that affect the smallest fibers. Other common tests include different type of blood work, and rarely, spinal fluid testing, and sometimes we resort to asking for a nerve and muscle biopsy or a skin punch biopsy.  We will check blood work today and call you with the test results. We will do an EMG and nerve conduction velocity test, which is an electrical nerve and muscle test, which we will schedule. We will call you with the results.

## 2019-04-06 NOTE — Progress Notes (Signed)
Subjective:    Patient ID: Alan Snyder is a 73 y.o. male.  HPI     Star Age, MD, PhD [facility] [address] [place], [place] [address]  Dear Dr. Nyra Capes, I saw your patient, Alan Snyder, upon your kind request in my neurologic clinic today for initial consultation of his numbness and tingling in his feet concern for neuropathy.  The patient is accompanied by his wife today.  As you know, Alan Snyder is a 73 year old right-handed gentleman, semiretired dentist, with an underlying medical history of hypertension, hyperlipidemia, prediabetes with recently normal A1c of 5.5 as checked last year per patient report, elevated PSA with status post prostatectomy in 2009, and overweight state, who reports numbness and tingling in the lower extremities particularly in both feet and ankle areas for the past 1-1/2 years.  Symptoms started with bilateral toe numbness.  With time they have progressed to involve the balls of the feet feeling numb and at times with stinging sensation and he feels that he has numbness up to the ankle areas.  He is not aware of any family history of peripheral neuropathy.  He is not on any medication for diabetes.  He drinks caffeine and limitation, 1 cup/day and alcohol occasionally, no history of heavy alcohol consumption.  He has not had any weakness.  He does not have any sustained painful sensations but has the occasional tingling and stinging, no sustained burning sensation.  He has been on gabapentin which was increased to 300 mg twice daily recently.  He does not feel like he is noticing any telltale benefit from taking the gabapentin, his wife has noticed that he may be a little bit more tired during the day.  He has not had any recent falls.  He has no symptoms in the hands.  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  He has occasional back discomfort but no radiating pain such as sciatica.  He lives  with his wife, his father-in-law stays with them.  They have 4 grown daughters.  He quit smoking in 2002.  He does notice when he does not take his Celebrex.  He has a history of arthritis.    His Past Medical History Is Significant For: Past Medical History:  Diagnosis Date  . Elevated PSA    His Past Surgical History Is Significant For:   His Family History Is Significant For: No family history on file.  His Social History Is Significant For: Social History   Socioeconomic History  . Marital status: Married    Spouse name: Not on file  . Number of children: Not on file  . Years of education: Not on file  . Highest education level: Not on file  Occupational History  . Not on file  Social Needs  . Financial resource strain: Not on file  . Food insecurity    Worry: Not on file    Inability: Not on file  . Transportation needs    Medical: Not on file    Non-medical: Not on file  Tobacco Use  . Smoking status: Light Tobacco Smoker  . Smokeless tobacco: Never Used  Substance and Sexual Activity  . Alcohol use: Yes  . Drug use: Not on file  . Sexual activity: Not on file  Lifestyle  . Physical activity    Days per week: Not on file    Minutes per session: Not on file  . Stress: Not on  file  Relationships  . Social Herbalist on phone: Not on file    Gets together: Not on file    Attends religious service: Not on file    Active member of club or organization: Not on file    Attends meetings of clubs or organizations: Not on file    Relationship status: Not on file  Other Topics Concern  . Not on file  Social History Narrative  . Not on file    His Allergies Are:  Allergies  Allergen Reactions  . Morphine And Related   :   His Current Medications Are:  Outpatient Encounter Medications as of 04/06/2019  Medication Sig  . celecoxib (CELEBREX) 200 MG capsule Take 200 mg by mouth 2 (two) times daily.  Marland Kitchen ezetimibe (ZETIA) 10 MG tablet Take 10 mg by  mouth daily.  Marland Kitchen gabapentin (NEURONTIN) 300 MG capsule Take 300 mg by mouth 2 (two) times daily.  . nebivolol (BYSTOLIC) 5 MG tablet Take 5 mg by mouth daily.  . valsartan-hydrochlorothiazide (DIOVAN-HCT) 320-25 MG tablet Take 1 tablet by mouth daily.  . [DISCONTINUED] allopurinol (ZYLOPRIM) 100 MG tablet Take 100 mg by mouth daily.  . [DISCONTINUED] Cholecalciferol (VITAMIN D3 PO) Take by mouth.  . [DISCONTINUED] Cyanocobalamin (VITAMIN B-12 PO) Take by mouth.  . [DISCONTINUED] fluticasone (FLONASE) 50 MCG/ACT nasal spray Place into both nostrils daily.   No facility-administered encounter medications on file as of 04/06/2019.   :   Review of Systems:  Out of a complete 14 point review of systems, all are reviewed and negative with the exception of these symptoms as listed below: Review of Systems  Neurological:       Pt presents today to discuss numbness in his bilateral feet that is progressing.    Objective:  Neurological Exam  Physical Exam Physical Examination:   Vitals:   04/06/19 1001  BP: 133/78  Pulse: 78  Temp: (!) 97.4 F (36.3 C)    General Examination: The patient is a very pleasant 73 y.o. male in no acute distress. He appears well-developed and well-nourished and well groomed.   HEENT: Normocephalic, atraumatic, pupils are equal, round and reactive to light and accommodation.  Extraocular tracking is good without limitation to gaze excursion or nystagmus noted. Normal smooth pursuit is noted. Hearing is grossly intact. Face is symmetric with normal facial animation and normal facial sensation. Speech is clear with no dysarthria noted. There is no hypophonia. There is no lip, neck/head, jaw or voice tremor. Neck is supple with full range of passive and active motion. There are no carotid bruits on auscultation. Oropharynx exam reveals: moderate mouth dryness, good dental hygiene and mild airway crowding. Tongue protrudes centrally and palate elevates  symmetrically.  Chest: Clear to auscultation without wheezing, rhonchi or crackles noted.  Heart: S1+S2+0, regular and normal without murmurs, rubs or gallops noted.   Abdomen: Soft, non-tender and non-distended with normal bowel sounds appreciated on auscultation.  Extremities: There is no pitting edema in the distal lower extremities bilaterally. Pedal pulses are intact.  Skin: Warm and dry without trophic changes noted. There are no varicose veins.  Musculoskeletal: exam reveals no obvious joint deformities, tenderness or joint swelling or erythema.   Neurologically:  Mental status: The patient is awake, alert and oriented in all 4 spheres. His immediate and remote memory, attention, language skills and fund of knowledge are appropriate. There is no evidence of aphasia, agnosia, apraxia or anomia. Speech is clear with normal prosody and  enunciation. Thought process is linear. Mood is normal and affect is normal.  Cranial nerves II - XII are as described above under HEENT exam. In addition: shoulder shrug is normal with equal shoulder height noted. Motor exam: Normal bulk, strength and tone is noted. There is no drift, tremor or rebound. Romberg is negative. Reflexes are 2+ In the upper extremities, 1+ in the knees, trace in the ankles. Babinski: Toes are flexor bilaterally. Fine motor skills and coordination: intact with normal finger taps, normal hand movements, normal rapid alternating patting, normal foot taps and normal foot agility.  Cerebellar testing: No dysmetria or intention tremor on finger to nose testing. Heel to shin is unremarkable bilaterally, With slight decrease in range of motion in the left hip area. There is no truncal or gait ataxia.  Sensory exam: intact to light touch, pinprick, vibration, temperature sense in the upper extremities And decreased sensation to pinprick and temperature sense in the lower extremities distally up to above ankle bones.  Gait, station and  balance: He stands easily. No veering to one side is noted. No leaning to one side is noted. Posture is age-appropriate and stance is narrow based. Gait shows normal stride length and normal pace. No problems turning are noted. Tandem walk is Difficult for him. Intact toe stance is noted.               Assessment and plan:   In summary, Chanze Vanegas is a very pleasant 73 y.o.-year old male with an underlying medical history of hypertension, hyperlipidemia, prediabetes with recently normal A1c of 5.5 as checked last year per patient report, elevated PSA with status post prostatectomy in 2009, and overweight state, who Presents for evaluation of his bilateral feet numbness and tingling with some discomfort reported but no frank pain thankfully.  Examination shows decreased sensation to pinprick and temperature in the distal lower extremities symmetrically, history and examination in keeping with peripheral neuropathy.  Prediabetes can cause neuropathy but we should look for other treatable causes.  I suggested blood work today and that we proceed with EMG and nerve conduction testing.  He is agreeable.  We talked about the common causes of peripheral neuropathy.  He does not have any telltale causes other than being prediabetic for some years with normal A1c reported in the recent past.  He had a diabetic eye examination With a retina specialist and was told his eye examination was normal in that regard.  There was no evidence of any diabetes related changes thankfully.  His examination otherwise is nonfocal which is reassuring.  We talked about healthy lifestyle, good nutrition, good hydration with water and ongoing diet control for prediabetes.  We will call him with his blood test results and EMG and nerve conduction velocity test results.  I suggested a follow-up after testing. He can certainly continue with the gabapentin at the current dose if he feels he benefits from it.  He is not quite sure if he has  had any telltale improvement in his symptoms.  He is advised that gabapentin helps with nerve related pain but does not alleviate numbness.  I answered all their questions today and the patient and his wife were in agreement with the plan.  Thank you very much for allowing me to participate in the care of this nice patient. If I can be of any further assistance to you please do not hesitate to call me at [phone].  Sincerely,   Star Age, MD, PhD

## 2019-04-08 ENCOUNTER — Telehealth: Payer: Self-pay

## 2019-04-08 NOTE — Telephone Encounter (Signed)
I called pt, left a detailed message on his cell phone, per DPR, with his lab results and recommendations. Pt was encouraged to call back if questions arise.

## 2019-04-08 NOTE — Telephone Encounter (Signed)
-----   Message from Star Age, MD sent at 04/08/2019  5:10 PM EDT ----- A1c was normal, not in the prediabetes range.  Kidney function shows mild kidney impairment.  B12 level was elevated, he is probably taking a supplement.  Please advise patient to follow-up with his primary care physician For ongoing monitoring of his kidney function. 1 test is pending which is the B1 vitamin, we will update if abnormal.  Otherwise, no obvious abnormality noted on blood tests, thyroid screening test with TSH also normal. Please call pt.Proceed with EMG and nerve conduction testing next month as scheduled.

## 2019-04-08 NOTE — Progress Notes (Signed)
A1c was normal, not in the prediabetes range.  Kidney function shows mild kidney impairment.  B12 level was elevated, he is probably taking a supplement.  Please advise patient to follow-up with his primary care physician For ongoing monitoring of his kidney function. 1 test is pending which is the B1 vitamin, we will update if abnormal.  Otherwise, no obvious abnormality noted on blood tests, thyroid screening test with TSH also normal. Please call pt.Proceed with EMG and nerve conduction testing next month as scheduled.

## 2019-04-13 LAB — COMPREHENSIVE METABOLIC PANEL
ALT: 39 IU/L (ref 0–44)
AST: 24 IU/L (ref 0–40)
Albumin/Globulin Ratio: 1.9 (ref 1.2–2.2)
Albumin: 4.4 g/dL (ref 3.7–4.7)
Alkaline Phosphatase: 64 IU/L (ref 39–117)
BUN/Creatinine Ratio: 19 (ref 10–24)
BUN: 27 mg/dL (ref 8–27)
Bilirubin Total: 0.6 mg/dL (ref 0.0–1.2)
CO2: 21 mmol/L (ref 20–29)
Calcium: 9.8 mg/dL (ref 8.6–10.2)
Chloride: 106 mmol/L (ref 96–106)
Creatinine, Ser: 1.39 mg/dL — ABNORMAL HIGH (ref 0.76–1.27)
GFR calc Af Amer: 58 mL/min/{1.73_m2} — ABNORMAL LOW (ref >59)
GFR calc non Af Amer: 50 mL/min/{1.73_m2} — ABNORMAL LOW (ref >59)
Globulin, Total: 2.3 g/dL (ref 1.5–4.5)
Glucose: 97 mg/dL (ref 65–99)
Potassium: 4.5 mmol/L (ref 3.5–5.2)
Sodium: 140 mmol/L (ref 134–144)
Total Protein: 6.7 g/dL (ref 6.0–8.5)

## 2019-04-13 LAB — MULTIPLE MYELOMA PANEL, SERUM
Albumin SerPl Elph-Mcnc: 4.1 g/dL (ref 2.9–4.4)
Albumin/Glob SerPl: 1.6 (ref 0.7–1.7)
Alpha 1: 0.2 g/dL (ref 0.0–0.4)
Alpha2 Glob SerPl Elph-Mcnc: 0.8 g/dL (ref 0.4–1.0)
B-Globulin SerPl Elph-Mcnc: 0.9 g/dL (ref 0.7–1.3)
Gamma Glob SerPl Elph-Mcnc: 0.8 g/dL (ref 0.4–1.8)
Globulin, Total: 2.6 g/dL (ref 2.2–3.9)
IgA/Immunoglobulin A, Serum: 224 mg/dL (ref 61–437)
IgG (Immunoglobin G), Serum: 1085 mg/dL (ref 603–1613)
IgM (Immunoglobulin M), Srm: 45 mg/dL (ref 15–143)

## 2019-04-13 LAB — VITAMIN B1: Thiamine: 154.1 nmol/L (ref 66.5–200.0)

## 2019-04-13 LAB — B12 AND FOLATE PANEL
Folate: >20 ng/mL (ref >3.0)
Vitamin B-12: >2000 pg/mL — ABNORMAL HIGH (ref 232–1245)

## 2019-04-13 LAB — HGB A1C W/O EAG: Hgb A1c MFr Bld: 5.4 % (ref 4.8–5.6)

## 2019-04-13 LAB — ANA W/REFLEX: Anti Nuclear Antibody (ANA): NEGATIVE

## 2019-04-13 LAB — TSH: TSH: 4.49 u[IU]/mL (ref 0.450–4.500)

## 2019-04-13 LAB — SEDIMENTATION RATE: Sed Rate: 22 mm/hr (ref 0–30)

## 2019-05-06 ENCOUNTER — Encounter (INDEPENDENT_AMBULATORY_CARE_PROVIDER_SITE_OTHER): Payer: Medicare Other | Admitting: Diagnostic Neuroimaging

## 2019-05-06 ENCOUNTER — Other Ambulatory Visit: Payer: Self-pay

## 2019-05-06 ENCOUNTER — Ambulatory Visit (INDEPENDENT_AMBULATORY_CARE_PROVIDER_SITE_OTHER): Payer: Medicare Other | Admitting: Diagnostic Neuroimaging

## 2019-05-06 DIAGNOSIS — R200 Anesthesia of skin: Secondary | ICD-10-CM

## 2019-05-06 DIAGNOSIS — Z0289 Encounter for other administrative examinations: Secondary | ICD-10-CM

## 2019-05-06 DIAGNOSIS — R202 Paresthesia of skin: Secondary | ICD-10-CM

## 2019-05-06 DIAGNOSIS — G629 Polyneuropathy, unspecified: Secondary | ICD-10-CM | POA: Diagnosis not present

## 2019-05-13 NOTE — Procedures (Signed)
[facility]  NCS (NERVE CONDUCTION STUDY) WITH EMG (ELECTROMYOGRAPHY) REPORT   STUDY DATE: 05/06/19 PATIENT NAME: Alan Snyder DOB: 1946-05-05 MRN: [id number]  ORDERING CLINICIAN: Star Age, MD PhD   TECHNOLOGIST: Sherre Scarlet ELECTROMYOGRAPHER: Earlean Polka. Beya Tipps, MD  CLINICAL INFORMATION: 73 year old male with bilateral foot numbness.  FINDINGS: NERVE CONDUCTION STUDY: Bilateral tibial motor responses have decreased amplitudes and mildly slow conduction velocities.  Right peroneal motor response has normal amplitude and mildly slow conduction velocity.  Left peroneal motor responses prolonged distal latency, decreased amplitude and mildly slow conduction velocity with stimulation at the popliteal fossa.    Bilateral superficial peroneal responses could not be obtained.  Bilateral sural sensory responses of decreased amplitudes.  Bilateral tibial F wave latencies are prolonged.   NEEDLE ELECTROMYOGRAPHY:  Needle examination of right lower extremity vastus medialis, tibialis anterior, gastrocnemius is normal.   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    INTERPRETING PHYSICIAN:  Penni Bombard, MD Certified in Neurology, Neurophysiology and Neuroimaging  [facility] [address] [place], [place] [address] [phone]   [facility]    Nerve / Sites Muscle Latency Ref. Amplitude Ref. Rel Amp Segments Distance Velocity Ref. Area    ms ms mV mV %  cm m/s m/s mVms  R Peroneal - EDB     Ankle EDB 5.9 ?6.5 3.2 ?2.0 100 Ankle - EDB 9   10.8     Fib head EDB 15.2  2.9  91.9 Fib head - Ankle 35 38 ?44 10.2     Pop fossa EDB 17.8  2.8  97.2 Pop fossa - Fib head 10 38 ?44 9.9         Pop fossa - Ankle      L Peroneal - EDB     Ankle EDB 7.9 ?6.5 0.5 ?2.0 100 Ankle - EDB 9   2.9     Fib head EDB 15.9  0.5  96.2 Fib head - Ankle 35 44 ?44 2.5     Pop fossa EDB 18.4  0.4  83.4 Pop  fossa - Fib head 10 40 ?44 1.7         Pop fossa - Ankle      R Tibial - AH     Ankle AH 4.4 ?5.8 1.2 ?4.0 100 Ankle - AH 9   4.3     Pop fossa AH 15.9  1.3  102 Pop fossa - Ankle 43 37 ?41 3.0  L Tibial - AH     Ankle AH 4.8 ?5.8 1.3 ?4.0 100 Ankle - AH 9   4.5     Pop fossa AH 16.1  0.9  70.5 Pop fossa - Ankle 41 36 ?41 4.9             SNC    Nerve / Sites Rec. Site Peak Lat Ref.  Amp Ref. Segments Distance    ms ms V V  cm  R Sural - Ankle (Calf)     Calf Ankle 3.8 ?4.4 3 ?6 Calf - Ankle 14  L Sural - Ankle (Calf)     Calf Ankle 3.8 ?4.4 3 ?6 Calf - Ankle 14  R Superficial peroneal - Ankle     Lat leg Ankle NR ?4.4 NR ?6 Lat leg - Ankle 14  L Superficial peroneal - Ankle     Lat leg Ankle NR ?4.4 NR ?6 Lat leg - Ankle 14  F  Wave    Nerve F Lat Ref.   ms ms  R Tibial - AH 61.7 ?56.0  L Tibial - AH 65.2 ?56.0         EMG full       EMG Summary Table    Spontaneous MUAP Recruitment  Muscle IA Fib PSW Fasc Other Amp Dur. Poly Pattern  R. Vastus medialis Normal None None None _______ Normal Normal Normal Normal  R. Tibialis anterior Normal None None None _______ Normal Normal Normal Normal  R. Gastrocnemius (Medial head) Normal None None None _______ Normal Normal Normal Normal

## 2019-05-17 ENCOUNTER — Telehealth: Payer: Self-pay

## 2019-05-17 NOTE — Telephone Encounter (Signed)
Pt has called RN Kristen back for results.  Please call

## 2019-05-17 NOTE — Telephone Encounter (Signed)
-----   Message from Star Age, MD sent at 05/17/2019  9:59 AM EST ----- Please advise the patient that his EMG nerve conduction study did show evidence of neuropathy.Findings are overall on the mild side.  Unfortunately, we have not found an actual cause of his neuropathy, does not appear to be diabetes or even prediabetes.We have done a number of other blood tests which did not show any treatable cause for neuropathy. If he would like, we can consider a second opinion with a neuromuscular specialist at one of the academic centers such as [facility] or [facility] or [facility] for further more detailed work-up and consideration for rarer causes of neuropathy.  If he is agreeable, we can certainly place a referral to a place of his choosing.  Michel Bickers

## 2019-05-17 NOTE — Telephone Encounter (Signed)
I called pt and discussed his NCV/EMG results with him. He declined a referral to [facility] at this time for a second opinion but will consider it. Pt will keep his f/u in January. Pt verbalized understanding of results. Pt had no questions at this time but was encouraged to call back if questions arise.

## 2019-05-17 NOTE — Progress Notes (Signed)
Please advise the patient that his EMG nerve conduction study did show evidence of neuropathy.Findings are overall on the mild side.  Unfortunately, we have not found an actual cause of his neuropathy, does not appear to be diabetes or even prediabetes.We have done a number of other blood tests which did not show any treatable cause for neuropathy. If he would like, we can consider a second opinion with a neuromuscular specialist at one of the academic centers such as [facility] or [facility] or [facility] for further more detailed work-up and consideration for rarer causes of neuropathy.  If he is agreeable, we can certainly place a referral to a place of his choosing.  Michel Bickers

## 2019-05-17 NOTE — Telephone Encounter (Signed)
I called pt to discuss his EMG/NCV results. No answer, left a message asking him to call me back.

## 2019-06-16 DIAGNOSIS — M9903 Segmental and somatic dysfunction of lumbar region: Secondary | ICD-10-CM | POA: Diagnosis not present

## 2019-06-16 DIAGNOSIS — M5126 Other intervertebral disc displacement, lumbar region: Secondary | ICD-10-CM | POA: Diagnosis not present

## 2019-06-23 DIAGNOSIS — M5126 Other intervertebral disc displacement, lumbar region: Secondary | ICD-10-CM | POA: Diagnosis not present

## 2019-06-23 DIAGNOSIS — M9903 Segmental and somatic dysfunction of lumbar region: Secondary | ICD-10-CM | POA: Diagnosis not present

## 2019-06-30 DIAGNOSIS — M9903 Segmental and somatic dysfunction of lumbar region: Secondary | ICD-10-CM | POA: Diagnosis not present

## 2019-06-30 DIAGNOSIS — M5126 Other intervertebral disc displacement, lumbar region: Secondary | ICD-10-CM | POA: Diagnosis not present

## 2019-07-08 ENCOUNTER — Encounter: Payer: Self-pay | Admitting: Neurology

## 2019-07-08 ENCOUNTER — Ambulatory Visit (INDEPENDENT_AMBULATORY_CARE_PROVIDER_SITE_OTHER): Payer: Medicare Other | Admitting: Neurology

## 2019-07-08 ENCOUNTER — Other Ambulatory Visit: Payer: Self-pay

## 2019-07-08 VITALS — BP 137/82 | HR 68 | Temp 97.7°F | Ht 73.0 in | Wt 228.0 lb

## 2019-07-08 DIAGNOSIS — G5793 Unspecified mononeuropathy of bilateral lower limbs: Secondary | ICD-10-CM

## 2019-07-08 DIAGNOSIS — G6289 Other specified polyneuropathies: Secondary | ICD-10-CM | POA: Diagnosis not present

## 2019-07-08 NOTE — Progress Notes (Signed)
Subjective:    Patient ID: Alan Snyder is a 74 y.o. male.  HPI     Interim history:   Alan Snyder is a 74 year old right-handed gentleman, semiretired dentist, with an underlying medical history of hypertension, hyperlipidemia, prediabetes with recently normal A1c of 5.5 as checked last year per patient report, elevated PSA with status post prostatectomy in 2009, and overweight state, who presents for follow-up consultation of his lower extremity numbness and tingling, neuropathy.  The patient is unaccompanied today.  I first met him on 04/06/2019 at the request of his primary care physician, at which time he reported numbness affecting both feet, particularly the toes.  He had some tingling, no sustained painful sensation with the exception of occasional stinging sensation.  On examination, he had decreased temperature and pinprick sensation in the feet, up to about above ankle areas bilaterally.  I suggested we proceed with additional diagnostic testing in the form of blood work and EMG nerve conduction velocity testing.  Laboratory studies from 04/06/2019 indicated no prediabetes, and no obvious abnormalities with ESR, ANA, TSH, B1, B12, SPEP.  We called him with his lab test results. 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 We called him with his test results and I did suggest that we could consider a referral to a neuromuscular specialist at one of the academic centers for investigating rare causes of neuropathy as an obvious cause was not found on our testing.  He declined a referral at the time.  Today, 07/08/2019: He reports that his numbness has progressed a little bit.  He also has the occasional aching sensation in the toes and the arches of his feet, it is uncomfortable for him to be barefoot and walking on his bare feet, he has had a little bit more  balance issues especially when he is in the shower.  He had his second Covid vaccine a couple of days ago, has tolerated it well.  All 3 of his daughters were infected with Covid after Christmas and are thankfully recuperating.  He continues to take gabapentin 300 mg twice daily per PCP.  He reports that about 4 to 5 years ago he had a left hip replacement and the patient was encouraged by one of his patients to look into the type of hip replacement he had as some models had caused increase in heavy metals.  In particular, chromium levels, he was checked for this and had indeed some increase in his chromium level he reports.  He had a replacement of his hip subsequently and overall has done well but has not had his chromium level rechecked in all these years.  There was at that time no real need for rechecking it as the original material had been removed.  The patient's allergies, current medications, family history, past medical history, past social history, past surgical history and problem list were reviewed and updated as appropriate.   Previously:  04/06/2019: (He) reports numbness and tingling in the lower extremities particularly in both feet and ankle areas for the past 1-1/2 years.  Symptoms started with bilateral toe numbness.  With time they have progressed to involve the balls of the feet feeling numb and at times with stinging sensation and he feels that he has numbness up to the ankle areas.  He is not aware of any family history of peripheral neuropathy.  He is not on any  medication for diabetes.  He drinks caffeine and limitation, 1 cup/day and alcohol occasionally, no history of heavy alcohol consumption.  He has not had any weakness.  He does not have any sustained painful sensations but has the occasional tingling and stinging, no sustained burning sensation.  He has been on gabapentin which was increased to 300 mg twice daily recently.  He does not feel like he is noticing any telltale  benefit from taking the gabapentin, his wife has noticed that he may be a little bit more tired during the day.  He has not had any recent falls.  He has no symptoms in the hands.  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  He has occasional back discomfort but no radiating pain such as sciatica.  He lives with his wife, his father-in-law stays with them.  They have 4 grown daughters.  He quit smoking in 2002.  He does notice when he does not take his Celebrex.  He has a history of arthritis.    His Past Medical History Is Significant For: Past Medical History:  Diagnosis Date  . Elevated PSA     His Past Surgical History Is Significant For: Past Surgical History:  Procedure Laterality Date  . PROSTATECTOMY    . TOTAL HIP ARTHROPLASTY Left     His Family History Is Significant For: No family history on file.  His Social History Is Significant For: Social History   Socioeconomic History  . Marital status: Married    Spouse name: Not on file  . Number of children: Not on file  . Years of education: Not on file  . Highest education level: Not on file  Occupational History  . Not on file  Tobacco Use  . Smoking status: Light Tobacco Smoker  . Smokeless tobacco: Never Used  Substance and Sexual Activity  . Alcohol use: Yes  . Drug use: Not on file  . Sexual activity: Not on file  Other Topics Concern  . Not on file  Social History Narrative  . Not on file   Social Determinants of Health   Financial Resource Strain:   . Difficulty of Paying Living Expenses: Not on file  Food Insecurity:   . Worried About Charity fundraiser in the Last Year: Not on file  . Ran Out of Food in the Last Year: Not on file  Transportation Needs:   . Lack of Transportation (Medical): Not on file  . Lack of Transportation (Non-Medical): Not on file  Physical Activity:   . Days of Exercise per Week: Not on file  . Minutes of Exercise per Session: Not on file  Stress:    . Feeling of Stress : Not on file  Social Connections:   . Frequency of Communication with Friends and Family: Not on file  . Frequency of Social Gatherings with Friends and Family: Not on file  . Attends Religious Services: Not on file  . Active Member of Clubs or Organizations: Not on file  . Attends Archivist Meetings: Not on file  . Marital Status: Not on file    His Allergies Are:  Allergies  Allergen Reactions  . Morphine And Related   :   His Current Medications Are:  Outpatient Encounter Medications as of 07/08/2019  Medication Sig  . celecoxib (CELEBREX) 200 MG capsule Take 200 mg by mouth 2 (two) times daily.  Marland Kitchen ezetimibe (ZETIA) 10 MG tablet Take 10 mg by mouth  daily.  . gabapentin (NEURONTIN) 300 MG capsule Take 300 mg by mouth 2 (two) times daily.  . nebivolol (BYSTOLIC) 5 MG tablet Take 5 mg by mouth daily.  . valsartan-hydrochlorothiazide (DIOVAN-HCT) 320-25 MG tablet Take 1 tablet by mouth daily.   No facility-administered encounter medications on file as of 07/08/2019.  :  Review of Systems:  Out of a complete 14 point review of systems, all are reviewed and negative with the exception of these symptoms as listed below: Review of Systems  Neurological:       Pt reports neuropathy has progressed some since last visit.    Objective:  Neurological Exam  Physical Exam Physical Examination:   Vitals:   07/08/19 1119  BP: 137/82  Pulse: 68  Temp: 97.7 F (36.5 C)   General Examination: The patient is a very pleasant 74 y.o. male in no acute distress. He appears well-developed and well-nourished and well groomed.   HEENT: Normocephalic, atraumatic, pupils are equal, round and reactive to light and accommodation.  Extraocular tracking is good without limitation to gaze excursion or nystagmus noted. Normal smooth pursuit is noted. Hearing is grossly intact. Face is symmetric with normal facial animation and normal facial sensation. Speech is clear  with no dysarthria noted. There is no hypophonia. There is no lip, neck/head, jaw or voice tremor. Neck is supple with full range of passive and active motion. There are no carotid bruits on auscultation. Oropharynx exam reveals: moderate mouth dryness, good dental hygiene and mild airway crowding. Tongue protrudes centrally and palate elevates symmetrically.  Chest: Clear to auscultation without wheezing, rhonchi or crackles noted.  Heart: S1+S2+0, regular and normal without murmurs, rubs or gallops noted.   Abdomen: Soft, non-tender and non-distended with normal bowel sounds appreciated on auscultation.  Extremities: There is no pitting edema in the distal lower extremities bilaterally. Pedal pulses are intact.  Skin: Warm and dry without trophic changes noted. Slightly livid appearing discoloration of the second through fourth toe on the left, slightly cooler.,  He reports that he has noticed this recently, denies any painful blanching and discoloration on a recurrent basis, no symptoms in the hands.  Musculoskeletal: exam reveals no obvious joint deformities, tenderness or joint swelling or erythema.   Neurologically:  Mental status: The patient is awake, alert and oriented in all 4 spheres. His immediate and remote memory, attention, language skills and fund of knowledge are appropriate. There is no evidence of aphasia, agnosia, apraxia or anomia. Speech is clear with normal prosody and enunciation. Thought process is linear. Mood is normal and affect is normal.  Cranial nerves II - XII are as described above under HEENT exam. In addition: shoulder shrug is normal with equal shoulder height noted. Motor exam: Normal bulk, But overall slender extremities, normal strength, good tone, no tremor, no drift, reflexes 2+ In the upper extremities, 1+ in the knees, absent in the ankles, ?trace. Babinski: Toes are flexor bilaterally.Romberg is negative with the exception of initial swaying noted.  Fine motor skills and coordination: intact with normal finger taps, normal hand movements, normal rapid alternating patting, normal foot taps and normal foot agility.  Cerebellar testing: No dysmetria or intention tremor on finger to nose testing. Heel to shin is unremarkable bilaterally, With slight decrease in range of motion in the left hip area. There is no truncal or gait ataxia.  Sensory exam: intact to light touch, pinprick, vibration, temperature sense in the upper extremities And decreased sensation to pinprick and temperature sense in the  lower extremities distally up to above ankle bones, Slightly higher/more proximal compared to last time.  Gait, station and balance: He stands easily. No veering to one side is noted. No leaning to one side is noted. Posture is age-appropriate and stance is narrow based. Gait shows normal stride length and normal pace. No problems turning are noted. Tandem walk is Difficult for him, unchanged.   Assessment and plan:   In summary, Alan Snyder is a very pleasant 74 year old male with an underlying medical history of hypertension, hyperlipidemia, with status post prostatectomy in 2009, and overweight state, who Presents for Follow-up consultation of his bilateral lower extremity numbness and discomfort, with history and electro physiologic testing in keeping with neuropathy. EMG and nerve conduction velocity testing was in keeping with axonal sensorimotor neuropathy, we did blood work also in October which showed no evidence of diabetes or prediabetes and otherwise benign findings. I would like to proceed with additional blood work today including CK level, heavy metal screen, and vitamin B6. We will keep him posted by phone call. He is agreeable to a referral to a neuromuscular/peripheral neuropathy specialist at [facility]. He is advised to continue with the gabapentin for symptomatic control of his discomfort. Thankfully, he has no severe pain. He is advised to  stay active and stay well-hydrated with water and follow-up routinely in 4 to 6 months here. We will keep him posted as to his blood test results by phone call in the interim. He is advised to call our office if he has not heard about his referral out in the next 2 or maybe 3 weeks. I answered all his questions today and he was in agreement.

## 2019-07-08 NOTE — Patient Instructions (Signed)
As discussed we will do a little bit more blood work today and call you with the results.  We will also initiate a referral to [facility] neurology for evaluation for your peripheral neuropathy of currently unclear etiology.  Please continue with your gabapentin as prescribed by your PCP.  Follow-up in about 4 to 6 months with me.

## 2019-07-11 LAB — HEAVY METALS PROFILE II, BLOOD
Arsenic: 11 ug/L (ref 2–23)
Cadmium: <0.5 ug/L (ref 0.0–1.2)
Lead, Blood: <1 ug/dL (ref 0–4)
Mercury: 4.8 ug/L (ref 0.0–14.9)

## 2019-07-11 LAB — CK: Total CK: 139 U/L (ref 41–331)

## 2019-07-11 LAB — VITAMIN B6: Vitamin B6: 24.4 ug/L (ref 5.3–46.7)

## 2019-07-12 ENCOUNTER — Telehealth: Payer: Self-pay

## 2019-07-12 LAB — SPECIMEN STATUS REPORT

## 2019-07-12 NOTE — Progress Notes (Signed)
Heavy metal profile including cadmium, lead, mercury and arsenic was normal, as well aw vit B6 and CK, muscle enzyme.  Please update Dr. Maurine Cane.

## 2019-07-12 NOTE — Telephone Encounter (Signed)
That is interesting that these are not typically part of the heavy metal profile that I ordered. I was not aware of this. Can you check with Alan Snyder, if we can add these on to his recent test?

## 2019-07-12 NOTE — Telephone Encounter (Signed)
-----   Message from Star Age, MD sent at 07/12/2019  7:43 AM EST ----- Heavy metal profile including cadmium, lead, mercury and arsenic was normal, as well aw vit B6 and CK, muscle enzyme.  Please update Dr. Maurine Cane.

## 2019-07-12 NOTE — Telephone Encounter (Signed)
I checked in with Alan Snyder she sts she has contacted [facility] and requested the labs to be added on for cobalt and chromium. She sts we should receiving a fax to sign verifying the orders. I have received the fax and completed and faxed back to [facility].0 Dr. Maurine Cane has been updated on this information as well.

## 2019-07-12 NOTE — Telephone Encounter (Signed)
I reached out to the pt and advised of results. Pt verbalized understanding but wanted to know his colbalt and chromium levels. Pt was under the impression that both of these labs were to be included with his most recent lab work up. Pt was advised I would check with MD on this. I did not see these labs included in the heavy metal profile.

## 2019-07-12 NOTE — Telephone Encounter (Signed)
I have checked with Alan Snyder and she is going to check with [facility] on adding these blood tests. She will get back with me.

## 2019-07-14 DIAGNOSIS — M5126 Other intervertebral disc displacement, lumbar region: Secondary | ICD-10-CM | POA: Diagnosis not present

## 2019-07-14 DIAGNOSIS — M9903 Segmental and somatic dysfunction of lumbar region: Secondary | ICD-10-CM | POA: Diagnosis not present

## 2019-07-14 LAB — SPECIMEN STATUS REPORT

## 2019-07-14 LAB — COBALT: Cobalt, Plasma: <1 ug/L (ref 0.0–0.9)

## 2019-07-14 LAB — CHROMIUM LEVEL: Chromium, Plasma: 2.6 ug/L — ABNORMAL HIGH (ref 0.1–2.1)

## 2019-07-14 NOTE — Progress Notes (Signed)
Please update Dr. Maurine Cane: Chromium is mildly elevated at 2.6 mcg/l, high normal value for this lab is 2.1. I am not sure if this is relevant enough and may cause his neuropathy. If he wishes, we can try to see if there is a toxicologist we can consult, I will have to look, if there is one at [facility] or [place] or [facility]. Let me know, what he wants to do. Can you also check with Hinton Dyer, if we can refer to toxicology?

## 2019-07-15 ENCOUNTER — Telehealth: Payer: Self-pay

## 2019-07-15 DIAGNOSIS — G629 Polyneuropathy, unspecified: Secondary | ICD-10-CM

## 2019-07-15 NOTE — Telephone Encounter (Signed)
-----   Message from Star Age, MD sent at 07/14/2019  7:47 AM EST ----- Please update Dr. Maurine Cane: Chromium is mildly elevated at 2.6 mcg/l, high normal value for this lab is 2.1. I am not sure if this is relevant enough and may cause his neuropathy. If he wishes, we can try to see if there is a toxicologist we can consult, I will have to look, if there is one at [facility] or [place] or [facility]. Let me know, what he wants to do. Can you also check with Hinton Dyer, if we can refer to toxicology?

## 2019-07-15 NOTE — Telephone Encounter (Signed)
LVM asking for pt to call back so we could discuss.

## 2019-07-15 NOTE — Telephone Encounter (Signed)
-----   Message from Star Age, MD sent at 07/14/2019  7:47 AM EST ----- Please update Dr. Maurine Cane: Chromium is mildly elevated at 2.6 mcg/l, high normal value for this lab is 2.1. I am not sure if this is relevant enough and may cause his neuropathy. If he wishes, we can try to see if there is a toxicologist we can consult, I will have to look, if there is one at [facility] or [place] or [facility]. Let me know, what he wants to do. Can you also check with Hinton Dyer, if we can refer to toxicology?

## 2019-07-15 NOTE — Telephone Encounter (Signed)
I contacted the pt and advised of results. Pt was agreeable to seeing a toxicologist and would prefer to be seen by [facility] possibly on the same day as his new pt appointment with the neuromuscular specialist.  Will send message to [facility] further.

## 2019-07-19 DIAGNOSIS — E782 Mixed hyperlipidemia: Secondary | ICD-10-CM | POA: Diagnosis not present

## 2019-07-19 DIAGNOSIS — I10 Essential (primary) hypertension: Secondary | ICD-10-CM | POA: Diagnosis not present

## 2019-07-19 DIAGNOSIS — Z6830 Body mass index (BMI) 30.0-30.9, adult: Secondary | ICD-10-CM | POA: Diagnosis not present

## 2019-07-19 DIAGNOSIS — E1140 Type 2 diabetes mellitus with diabetic neuropathy, unspecified: Secondary | ICD-10-CM | POA: Diagnosis not present

## 2019-07-20 NOTE — Addendum Note (Signed)
Addended by: Verlin Grills T on: 07/20/2019 05:01 PM   Modules accepted: Orders

## 2019-07-20 NOTE — Telephone Encounter (Signed)
I spoke with Alan Snyder and she requested I place a standard PT order then she would edit to reflect the pt is being referred to toxicologist for  neuropathy and increased level of chromium. I have placed the needed order.

## 2019-07-20 NOTE — Telephone Encounter (Signed)
[facility] does not have toxicologist  . Patient will have to go to [facility] . Please advise and referral will need to be placed. Thanks Hinton Dyer

## 2019-07-20 NOTE — Telephone Encounter (Signed)
I cannot place a referral to toxicology, can you tell me what it would fall under?  Alan Snyder, would you put the referral and once we have more information, send it to [facility] for indication: neuropathy and increased level of chromium.

## 2019-07-21 DIAGNOSIS — M5126 Other intervertebral disc displacement, lumbar region: Secondary | ICD-10-CM | POA: Diagnosis not present

## 2019-07-21 DIAGNOSIS — M9903 Segmental and somatic dysfunction of lumbar region: Secondary | ICD-10-CM | POA: Diagnosis not present

## 2019-07-22 NOTE — Telephone Encounter (Signed)
Called and spoke to patient he has to call and set up everything for him self . Patient will call me if he needs anything else .    Address: [address], [place], [place] [address] Hours:  Open ? Closes [place]: Mask required  Staff wear masks  More details Phone: [phone] Appointments: [email or web address]

## 2019-08-04 DIAGNOSIS — M9903 Segmental and somatic dysfunction of lumbar region: Secondary | ICD-10-CM | POA: Diagnosis not present

## 2019-08-04 DIAGNOSIS — M5126 Other intervertebral disc displacement, lumbar region: Secondary | ICD-10-CM | POA: Diagnosis not present

## 2019-08-17 DIAGNOSIS — H353131 Nonexudative age-related macular degeneration, bilateral, early dry stage: Secondary | ICD-10-CM | POA: Diagnosis not present

## 2019-08-17 DIAGNOSIS — E119 Type 2 diabetes mellitus without complications: Secondary | ICD-10-CM | POA: Diagnosis not present

## 2019-08-17 DIAGNOSIS — H2513 Age-related nuclear cataract, bilateral: Secondary | ICD-10-CM | POA: Diagnosis not present

## 2019-08-17 DIAGNOSIS — H43812 Vitreous degeneration, left eye: Secondary | ICD-10-CM | POA: Diagnosis not present

## 2019-08-17 DIAGNOSIS — H33301 Unspecified retinal break, right eye: Secondary | ICD-10-CM | POA: Diagnosis not present

## 2019-08-18 DIAGNOSIS — M9903 Segmental and somatic dysfunction of lumbar region: Secondary | ICD-10-CM | POA: Diagnosis not present

## 2019-08-18 DIAGNOSIS — M5126 Other intervertebral disc displacement, lumbar region: Secondary | ICD-10-CM | POA: Diagnosis not present

## 2019-09-01 DIAGNOSIS — M9903 Segmental and somatic dysfunction of lumbar region: Secondary | ICD-10-CM | POA: Diagnosis not present

## 2019-09-01 DIAGNOSIS — M5126 Other intervertebral disc displacement, lumbar region: Secondary | ICD-10-CM | POA: Diagnosis not present

## 2019-09-02 DIAGNOSIS — N529 Male erectile dysfunction, unspecified: Secondary | ICD-10-CM | POA: Diagnosis not present

## 2019-09-02 DIAGNOSIS — C61 Malignant neoplasm of prostate: Secondary | ICD-10-CM | POA: Diagnosis not present

## 2019-09-02 DIAGNOSIS — E291 Testicular hypofunction: Secondary | ICD-10-CM | POA: Diagnosis not present

## 2019-09-08 DIAGNOSIS — R200 Anesthesia of skin: Secondary | ICD-10-CM | POA: Diagnosis not present

## 2019-09-08 DIAGNOSIS — Z6830 Body mass index (BMI) 30.0-30.9, adult: Secondary | ICD-10-CM | POA: Diagnosis not present

## 2019-09-08 DIAGNOSIS — W57XXXA Bitten or stung by nonvenomous insect and other nonvenomous arthropods, initial encounter: Secondary | ICD-10-CM | POA: Diagnosis not present

## 2019-09-08 DIAGNOSIS — R519 Headache, unspecified: Secondary | ICD-10-CM | POA: Diagnosis not present

## 2019-09-17 DIAGNOSIS — Z6830 Body mass index (BMI) 30.0-30.9, adult: Secondary | ICD-10-CM | POA: Diagnosis not present

## 2019-09-17 DIAGNOSIS — R519 Headache, unspecified: Secondary | ICD-10-CM | POA: Diagnosis not present

## 2019-09-17 DIAGNOSIS — R200 Anesthesia of skin: Secondary | ICD-10-CM | POA: Diagnosis not present

## 2019-10-19 DIAGNOSIS — M9903 Segmental and somatic dysfunction of lumbar region: Secondary | ICD-10-CM | POA: Diagnosis not present

## 2019-10-19 DIAGNOSIS — M5126 Other intervertebral disc displacement, lumbar region: Secondary | ICD-10-CM | POA: Diagnosis not present

## 2019-10-20 DIAGNOSIS — M9903 Segmental and somatic dysfunction of lumbar region: Secondary | ICD-10-CM | POA: Diagnosis not present

## 2019-10-20 DIAGNOSIS — M5126 Other intervertebral disc displacement, lumbar region: Secondary | ICD-10-CM | POA: Diagnosis not present

## 2019-10-22 DIAGNOSIS — M9903 Segmental and somatic dysfunction of lumbar region: Secondary | ICD-10-CM | POA: Diagnosis not present

## 2019-10-22 DIAGNOSIS — M5126 Other intervertebral disc displacement, lumbar region: Secondary | ICD-10-CM | POA: Diagnosis not present

## 2019-11-01 DIAGNOSIS — Z885 Allergy status to narcotic agent status: Secondary | ICD-10-CM | POA: Diagnosis not present

## 2019-11-01 DIAGNOSIS — R292 Abnormal reflex: Secondary | ICD-10-CM | POA: Diagnosis not present

## 2019-11-01 DIAGNOSIS — G609 Hereditary and idiopathic neuropathy, unspecified: Secondary | ICD-10-CM | POA: Diagnosis not present

## 2020-01-06 ENCOUNTER — Ambulatory Visit: Payer: Medicare Other | Admitting: Neurology

## 2020-02-16 DIAGNOSIS — G6289 Other specified polyneuropathies: Secondary | ICD-10-CM | POA: Diagnosis not present

## 2020-02-16 DIAGNOSIS — G609 Hereditary and idiopathic neuropathy, unspecified: Secondary | ICD-10-CM | POA: Diagnosis not present

## 2020-03-09 DIAGNOSIS — E1140 Type 2 diabetes mellitus with diabetic neuropathy, unspecified: Secondary | ICD-10-CM | POA: Diagnosis not present

## 2020-03-09 DIAGNOSIS — E782 Mixed hyperlipidemia: Secondary | ICD-10-CM | POA: Diagnosis not present

## 2020-03-09 DIAGNOSIS — Z125 Encounter for screening for malignant neoplasm of prostate: Secondary | ICD-10-CM | POA: Diagnosis not present

## 2020-03-13 DIAGNOSIS — Z6831 Body mass index (BMI) 31.0-31.9, adult: Secondary | ICD-10-CM | POA: Diagnosis not present

## 2020-03-13 DIAGNOSIS — I10 Essential (primary) hypertension: Secondary | ICD-10-CM | POA: Diagnosis not present

## 2020-03-13 DIAGNOSIS — E782 Mixed hyperlipidemia: Secondary | ICD-10-CM | POA: Diagnosis not present

## 2020-03-13 DIAGNOSIS — E1140 Type 2 diabetes mellitus with diabetic neuropathy, unspecified: Secondary | ICD-10-CM | POA: Diagnosis not present

## 2020-03-29 DIAGNOSIS — Z23 Encounter for immunization: Secondary | ICD-10-CM | POA: Diagnosis not present

## 2020-04-04 DIAGNOSIS — N529 Male erectile dysfunction, unspecified: Secondary | ICD-10-CM | POA: Diagnosis not present

## 2020-04-04 DIAGNOSIS — E291 Testicular hypofunction: Secondary | ICD-10-CM | POA: Diagnosis not present

## 2020-04-04 DIAGNOSIS — C61 Malignant neoplasm of prostate: Secondary | ICD-10-CM | POA: Diagnosis not present

## 2020-05-08 DIAGNOSIS — Z79899 Other long term (current) drug therapy: Secondary | ICD-10-CM | POA: Diagnosis not present

## 2020-05-08 DIAGNOSIS — G609 Hereditary and idiopathic neuropathy, unspecified: Secondary | ICD-10-CM | POA: Diagnosis not present

## 2020-07-31 DIAGNOSIS — E1140 Type 2 diabetes mellitus with diabetic neuropathy, unspecified: Secondary | ICD-10-CM | POA: Diagnosis not present

## 2020-07-31 DIAGNOSIS — E782 Mixed hyperlipidemia: Secondary | ICD-10-CM | POA: Diagnosis not present

## 2020-08-09 DIAGNOSIS — I10 Essential (primary) hypertension: Secondary | ICD-10-CM | POA: Diagnosis not present

## 2020-08-09 DIAGNOSIS — Z0000 Encounter for general adult medical examination without abnormal findings: Secondary | ICD-10-CM | POA: Diagnosis not present

## 2020-08-09 DIAGNOSIS — E782 Mixed hyperlipidemia: Secondary | ICD-10-CM | POA: Diagnosis not present

## 2020-08-09 DIAGNOSIS — E669 Obesity, unspecified: Secondary | ICD-10-CM | POA: Diagnosis not present

## 2020-08-09 DIAGNOSIS — E1140 Type 2 diabetes mellitus with diabetic neuropathy, unspecified: Secondary | ICD-10-CM | POA: Diagnosis not present

## 2020-08-09 DIAGNOSIS — Z139 Encounter for screening, unspecified: Secondary | ICD-10-CM | POA: Diagnosis not present

## 2020-08-09 DIAGNOSIS — Z1331 Encounter for screening for depression: Secondary | ICD-10-CM | POA: Diagnosis not present

## 2020-08-09 DIAGNOSIS — Z1339 Encounter for screening examination for other mental health and behavioral disorders: Secondary | ICD-10-CM | POA: Diagnosis not present

## 2020-08-09 DIAGNOSIS — Z6830 Body mass index (BMI) 30.0-30.9, adult: Secondary | ICD-10-CM | POA: Diagnosis not present

## 2020-08-11 DIAGNOSIS — E782 Mixed hyperlipidemia: Secondary | ICD-10-CM | POA: Diagnosis not present

## 2020-08-14 ENCOUNTER — Encounter (INDEPENDENT_AMBULATORY_CARE_PROVIDER_SITE_OTHER): Payer: Self-pay | Admitting: Ophthalmology

## 2020-08-14 ENCOUNTER — Ambulatory Visit (INDEPENDENT_AMBULATORY_CARE_PROVIDER_SITE_OTHER): Payer: Medicare Other | Admitting: Ophthalmology

## 2020-08-14 ENCOUNTER — Other Ambulatory Visit: Payer: Self-pay

## 2020-08-14 DIAGNOSIS — H353131 Nonexudative age-related macular degeneration, bilateral, early dry stage: Secondary | ICD-10-CM

## 2020-08-14 DIAGNOSIS — H43811 Vitreous degeneration, right eye: Secondary | ICD-10-CM

## 2020-08-14 DIAGNOSIS — H43812 Vitreous degeneration, left eye: Secondary | ICD-10-CM | POA: Diagnosis not present

## 2020-08-14 DIAGNOSIS — H2513 Age-related nuclear cataract, bilateral: Secondary | ICD-10-CM | POA: Diagnosis not present

## 2020-08-14 DIAGNOSIS — H35411 Lattice degeneration of retina, right eye: Secondary | ICD-10-CM | POA: Diagnosis not present

## 2020-08-14 NOTE — Progress Notes (Signed)
08/14/2020     CHIEF COMPLAINT Patient presents for Retina Follow Up (1 Year F/U OU//Pt reports slight cloudiness due to cataracts, but sts they aren't bothering him too much. No other new symptoms reported OU.)   HISTORY OF PRESENT ILLNESS: Alan Snyder is a 75 y.o. male who presents to the clinic today for:   HPI    Retina Follow Up    Patient presents with  Dry AMD.  In both eyes.  This started 1 year ago.  Severity is mild.  Duration of 1 year.  Since onset it is stable. Additional comments: 1 Year F/U OU  Pt reports slight cloudiness due to cataracts, but sts they aren't bothering him too much. No other new symptoms reported OU.       Last edited by Rockie Neighbours, [place] on 08/14/2020  1:04 PM. (History)      Referring physician: Maryella Shivers, [place],  [place] [address]  HISTORICAL INFORMATION:   Selected notes from the MEDICAL RECORD NUMBER    Lab Results  Component Value Date   HGBA1C 5.4 04/06/2019     CURRENT MEDICATIONS: No current outpatient medications on file. (Ophthalmic Drugs)   No current facility-administered medications for this visit. (Ophthalmic Drugs)   Current Outpatient Medications (Other)  Medication Sig  . celecoxib (CELEBREX) 200 MG capsule Take 200 mg by mouth 2 (two) times daily.  Marland Kitchen ezetimibe (ZETIA) 10 MG tablet Take 10 mg by mouth daily.  Marland Kitchen gabapentin (NEURONTIN) 300 MG capsule Take 300 mg by mouth 2 (two) times daily.  . nebivolol (BYSTOLIC) 5 MG tablet Take 5 mg by mouth daily.  . valsartan-hydrochlorothiazide (DIOVAN-HCT) 320-25 MG tablet Take 1 tablet by mouth daily.   No current facility-administered medications for this visit. (Other)      REVIEW OF SYSTEMS:    ALLERGIES Allergies  Allergen Reactions  . Morphine And Related     PAST MEDICAL HISTORY Past Medical History:  Diagnosis Date  . Elevated PSA    Past Surgical History:  Procedure Laterality Date  . PROSTATECTOMY    .  TOTAL HIP ARTHROPLASTY Left     FAMILY HISTORY History reviewed. No pertinent family history.  SOCIAL HISTORY Social History   Tobacco Use  . Smoking status: Light Tobacco Smoker  . Smokeless tobacco: Never Used  Substance Use Topics  . Alcohol use: Yes         OPHTHALMIC EXAM:  Base Eye Exam    Visual Acuity (ETDRS)      Right Left   Dist [place] 20/30 -2 20/20 -2   Dist ph [place] 20/25 -2        Tonometry (Tonopen, 1:05 PM)      Right Left   Pressure 16 10       Pupils      Pupils Dark Light Shape React APD   Right PERRL 4 3 Round Brisk None   Left PERRL 4 3 Round Brisk None       Visual Fields (Counting fingers)      Left Right    Full Full       Extraocular Movement      Right Left    Full Full       Neuro/Psych    Oriented x3: Yes   Mood/Affect: Normal       Dilation    Both eyes: 1.0% Mydriacyl, 2.5% Phenylephrine @ 1:08 PM        Slit Lamp  and Fundus Exam    External Exam      Right Left   External Normal Normal       Slit Lamp Exam      Right Left   Lids/Lashes Normal Normal   Conjunctiva/Sclera White and quiet White and quiet   Cornea Clear Clear   Anterior Chamber Deep and quiet Deep and quiet   Iris Round and reactive Round and reactive   Lens Nuclear sclerosis 1+ Nuclear sclerosis 1+   Anterior Vitreous Normal Normal       Fundus Exam      Right Left   Posterior Vitreous Posterior vitreous detachment Posterior vitreous detachment   Disc Normal Normal   C/D Ratio 0.5 0.55   Macula Normal Normal   Vessels no DR no DR   Periphery good retinopexy at 9 and 12 o'clock Normal          IMAGING AND PROCEDURES  Imaging and Procedures for 08/14/20  OCT, Retina - OU - Both Eyes       Right Eye Quality was good. Scan locations included subfoveal. Central Foveal Thickness: 304. Findings include normal foveal contour.   Left Eye Quality was good. Scan locations included subfoveal. Central Foveal Thickness: 295. Findings include  normal foveal contour.                 ASSESSMENT/PLAN:  Lattice degeneration of peripheral retina, right History of retinal hole, well treated  Nuclear sclerotic cataract of both eyes The nature of cataract was discussed with the patient as well as the elective nature of surgery. The patient was reassured that surgery at a later date does not put the patient at risk for a worse outcome. It was emphasized that the need for surgery is dictated by the patient's quality of life as influenced by the cataract. Patient was instructed to maintain close follow up with their general eye care doctor.      ICD-10-CM   1. Early stage nonexudative age-related macular degeneration of both eyes  H35.3131 OCT, Retina - OU - Both Eyes  2. Posterior vitreous detachment of right eye  H43.811   3. Posterior vitreous detachment of left eye  H43.812   4. Nuclear sclerotic cataract of both eyes  H25.13   5. Lattice degeneration of peripheral retina, right  H35.411     1.  Findings reviewed with the patient.  Minor lens opacification is present OU, no impact on acuity at this time  2.  3.  Ophthalmic Meds Ordered this visit:  No orders of the defined types were placed in this encounter.      Return in about 1 year (around 08/14/2021) for DILATE OU, OCT.  There are no Patient Instructions on file for this visit.   Explained the diagnoses, plan, and follow up with the patient and they expressed understanding.  Patient expressed understanding of the importance of proper follow up care.   Clent Demark Rankin M.D. Diseases & Surgery of the Retina and Vitreous [facility] [place] 08/14/20     Abbreviations: M myopia (nearsighted); A astigmatism; H hyperopia (farsighted); P presbyopia; Mrx spectacle prescription;  CTL contact lenses; OD right eye; OS left eye; OU both eyes  XT exotropia; ET esotropia; PEK punctate epithelial keratitis; PEE punctate epithelial erosions; DES dry eye  syndrome; MGD meibomian gland dysfunction; ATs artificial tears; PFAT's preservative free artificial tears; [place] nuclear sclerotic cataract; PSC posterior subcapsular cataract; ERM epi-retinal membrane; PVD posterior vitreous detachment; RD retinal detachment;  DM diabetes mellitus; DR diabetic retinopathy; NPDR non-proliferative diabetic retinopathy; PDR proliferative diabetic retinopathy; CSME clinically significant macular edema; DME diabetic macular edema; dbh dot blot hemorrhages; CWS cotton wool spot; POAG primary open angle glaucoma; C/D cup-to-disc ratio; HVF humphrey visual field; GVF goldmann visual field; OCT optical coherence tomography; IOP intraocular pressure; BRVO Branch retinal vein occlusion; CRVO central retinal vein occlusion; CRAO central retinal artery occlusion; BRAO branch retinal artery occlusion; RT retinal tear; SB scleral buckle; PPV pars plana vitrectomy; VH Vitreous hemorrhage; PRP panretinal laser photocoagulation; IVK intravitreal kenalog; VMT vitreomacular traction; MH Macular hole;  NVD neovascularization of the disc; NVE neovascularization elsewhere; AREDS age related eye disease study; ARMD age related macular degeneration; POAG primary open angle glaucoma; EBMD epithelial/anterior basement membrane dystrophy; ACIOL anterior chamber intraocular lens; IOL intraocular lens; PCIOL posterior chamber intraocular lens; Phaco/IOL phacoemulsification with intraocular lens placement; [place] photorefractive keratectomy; LASIK laser assisted in situ keratomileusis; HTN hypertension; DM diabetes mellitus; COPD chronic obstructive pulmonary disease

## 2020-08-14 NOTE — Assessment & Plan Note (Signed)
History of retinal hole, well treated

## 2020-08-14 NOTE — Assessment & Plan Note (Signed)

## 2020-10-03 DIAGNOSIS — C61 Malignant neoplasm of prostate: Secondary | ICD-10-CM | POA: Diagnosis not present

## 2020-10-03 DIAGNOSIS — N529 Male erectile dysfunction, unspecified: Secondary | ICD-10-CM | POA: Diagnosis not present

## 2020-10-03 DIAGNOSIS — E291 Testicular hypofunction: Secondary | ICD-10-CM | POA: Diagnosis not present

## 2020-10-11 DIAGNOSIS — M5126 Other intervertebral disc displacement, lumbar region: Secondary | ICD-10-CM | POA: Diagnosis not present

## 2020-10-11 DIAGNOSIS — M9903 Segmental and somatic dysfunction of lumbar region: Secondary | ICD-10-CM | POA: Diagnosis not present

## 2020-10-13 DIAGNOSIS — M9903 Segmental and somatic dysfunction of lumbar region: Secondary | ICD-10-CM | POA: Diagnosis not present

## 2020-10-13 DIAGNOSIS — M5126 Other intervertebral disc displacement, lumbar region: Secondary | ICD-10-CM | POA: Diagnosis not present

## 2020-10-18 DIAGNOSIS — M5126 Other intervertebral disc displacement, lumbar region: Secondary | ICD-10-CM | POA: Diagnosis not present

## 2020-10-18 DIAGNOSIS — M9903 Segmental and somatic dysfunction of lumbar region: Secondary | ICD-10-CM | POA: Diagnosis not present

## 2020-11-01 DIAGNOSIS — N529 Male erectile dysfunction, unspecified: Secondary | ICD-10-CM | POA: Diagnosis not present

## 2020-11-22 DIAGNOSIS — M9903 Segmental and somatic dysfunction of lumbar region: Secondary | ICD-10-CM | POA: Diagnosis not present

## 2020-11-22 DIAGNOSIS — M5126 Other intervertebral disc displacement, lumbar region: Secondary | ICD-10-CM | POA: Diagnosis not present

## 2021-01-10 DIAGNOSIS — M9903 Segmental and somatic dysfunction of lumbar region: Secondary | ICD-10-CM | POA: Diagnosis not present

## 2021-01-10 DIAGNOSIS — M5126 Other intervertebral disc displacement, lumbar region: Secondary | ICD-10-CM | POA: Diagnosis not present

## 2021-02-05 DIAGNOSIS — E1140 Type 2 diabetes mellitus with diabetic neuropathy, unspecified: Secondary | ICD-10-CM | POA: Diagnosis not present

## 2021-02-05 DIAGNOSIS — E782 Mixed hyperlipidemia: Secondary | ICD-10-CM | POA: Diagnosis not present

## 2021-02-09 DIAGNOSIS — M9903 Segmental and somatic dysfunction of lumbar region: Secondary | ICD-10-CM | POA: Diagnosis not present

## 2021-02-09 DIAGNOSIS — M5126 Other intervertebral disc displacement, lumbar region: Secondary | ICD-10-CM | POA: Diagnosis not present

## 2021-02-19 DIAGNOSIS — E1140 Type 2 diabetes mellitus with diabetic neuropathy, unspecified: Secondary | ICD-10-CM | POA: Diagnosis not present

## 2021-02-19 DIAGNOSIS — I10 Essential (primary) hypertension: Secondary | ICD-10-CM | POA: Diagnosis not present

## 2021-02-19 DIAGNOSIS — E782 Mixed hyperlipidemia: Secondary | ICD-10-CM | POA: Diagnosis not present

## 2021-02-19 DIAGNOSIS — N179 Acute kidney failure, unspecified: Secondary | ICD-10-CM | POA: Diagnosis not present

## 2021-02-21 DIAGNOSIS — M9903 Segmental and somatic dysfunction of lumbar region: Secondary | ICD-10-CM | POA: Diagnosis not present

## 2021-02-21 DIAGNOSIS — M5126 Other intervertebral disc displacement, lumbar region: Secondary | ICD-10-CM | POA: Diagnosis not present

## 2021-02-23 DIAGNOSIS — M1611 Unilateral primary osteoarthritis, right hip: Secondary | ICD-10-CM | POA: Diagnosis not present

## 2021-02-23 DIAGNOSIS — Z96642 Presence of left artificial hip joint: Secondary | ICD-10-CM | POA: Diagnosis not present

## 2021-03-21 DIAGNOSIS — N179 Acute kidney failure, unspecified: Secondary | ICD-10-CM | POA: Diagnosis not present

## 2021-03-21 DIAGNOSIS — Z23 Encounter for immunization: Secondary | ICD-10-CM | POA: Diagnosis not present

## 2021-03-26 DIAGNOSIS — Z01818 Encounter for other preprocedural examination: Secondary | ICD-10-CM | POA: Diagnosis not present

## 2021-03-26 DIAGNOSIS — G8929 Other chronic pain: Secondary | ICD-10-CM | POA: Diagnosis not present

## 2021-03-26 DIAGNOSIS — M25551 Pain in right hip: Secondary | ICD-10-CM | POA: Diagnosis not present

## 2021-03-26 DIAGNOSIS — Z139 Encounter for screening, unspecified: Secondary | ICD-10-CM | POA: Diagnosis not present

## 2021-03-26 DIAGNOSIS — N179 Acute kidney failure, unspecified: Secondary | ICD-10-CM | POA: Diagnosis not present

## 2021-03-26 DIAGNOSIS — Z1331 Encounter for screening for depression: Secondary | ICD-10-CM | POA: Diagnosis not present

## 2021-04-06 DIAGNOSIS — M1611 Unilateral primary osteoarthritis, right hip: Secondary | ICD-10-CM | POA: Diagnosis not present

## 2021-04-13 NOTE — Progress Notes (Signed)
DUE TO COVID-19 ONLY ONE VISITOR IS ALLOWED TO COME WITH YOU AND STAY IN THE WAITING ROOM ONLY DURING PRE OP AND PROCEDURE DAY OF SURGERY.  2 VISITOR  MAY VISIT WITH YOU AFTER SURGERY IN YOUR PRIVATE ROOM DURING VISITING HOURS ONLY!  YOU NEED TO HAVE A COVID 19 TEST ON__10/31/22 @_  @_from  8am-3pm _____, THIS TEST MUST BE DONE BEFORE SURGERY,  Covid test is done at [place], [place] [address].  This is a drive thru.  No appt required. Please see map.                 Your procedure is scheduled on:  04/26/2021   Report to [facility] Main  Entrance   Report to admitting at     [phone]     Call this number if you have problems the morning of surgery [phone]    REMEMBER: NO  SOLID FOOD CANDY OR GUM AFTER MIDNIGHT. CLEAR LIQUIDS UNTIL   0700am        . NOTHING BY MOUTH EXCEPT CLEAR LIQUIDS UNTIL 0700 am    . PLEASE FINISH ENSURE DRINK PER SURGEON ORDER  WHICH NEEDS TO BE COMPLETED AT  0700am    .      CLEAR LIQUID DIET   Foods Allowed                                                                    Coffee and tea, regular and decaf                            Fruit ices (not with fruit pulp)                                      Iced Popsicles                                    Carbonated beverages, regular and diet                                    Cranberry, grape and apple juices Sports drinks like Gatorade Lightly seasoned clear broth or consume(fat free) Sugar, honey syrup ___________________________________________________________________      BRUSH YOUR TEETH MORNING OF SURGERY AND RINSE YOUR MOUTH OUT, NO CHEWING GUM CANDY OR MINTS.     Take these medicines the morning of surgery with A SIP OF WATER:  gabapentin, bystolic   DO NOT TAKE ANY DIABETIC MEDICATIONS DAY OF YOUR SURGERY                               You may not have any metal on your body including hair pins and              piercings  Do not wear jewelry, make-up, lotions,  powders or perfumes, deodorant             Do  not wear nail polish on your fingernails.  Do not shave  48 hours prior to surgery.              Men may shave face and neck.   Do not bring valuables to the hospital. [place].  Contacts, dentures or bridgework may not be worn into surgery.  Leave suitcase in the car. After surgery it may be brought to your room.     Patients discharged the day of surgery will not be allowed to drive home. IF YOU ARE HAVING SURGERY AND GOING HOME THE SAME DAY, YOU MUST HAVE AN ADULT TO DRIVE YOU HOME AND BE WITH YOU FOR 24 HOURS. YOU MAY GO HOME BY TAXI OR UBER OR ORTHERWISE, BUT AN ADULT MUST ACCOMPANY YOU HOME AND STAY WITH YOU FOR 24 HOURS.  Name and phone number of your driver:  Special Instructions: N/A              Please read over the following fact sheets you were given: _____________________________________________________________________  [facility] - Preparing for Surgery Before surgery, you can play an important role.  Because skin is not sterile, your skin needs to be as free of germs as possible.  You can reduce the number of germs on your skin by washing with CHG (chlorahexidine gluconate) soap before surgery.  CHG is an antiseptic cleaner which kills germs and bonds with the skin to continue killing germs even after washing. Please DO NOT use if you have an allergy to CHG or antibacterial soaps.  If your skin becomes reddened/irritated stop using the CHG and inform your nurse when you arrive at Short Stay. Do not shave (including legs and underarms) for at least 48 hours prior to the first CHG shower.  You may shave your face/neck. Please follow these instructions carefully:  1.  Shower with CHG Soap the night before surgery and the  morning of Surgery.  2.  If you choose to wash your hair, wash your hair first as usual with your  normal  shampoo.  3.  After you shampoo, rinse your hair and body  thoroughly to remove the  shampoo.                           4.  Use CHG as you would any other liquid soap.  You can apply chg directly  to the skin and wash                       Gently with a scrungie or clean washcloth.  5.  Apply the CHG Soap to your body ONLY FROM THE NECK DOWN.   Do not use on face/ open                           Wound or open sores. Avoid contact with eyes, ears mouth and genitals (private parts).                       Wash face,  Genitals (private parts) with your normal soap.             6.  Wash thoroughly, paying special attention to the area where your surgery  will be performed.  7.  Thoroughly rinse your body with warm  water from the neck down.  8.  DO NOT shower/wash with your normal soap after using and rinsing off  the CHG Soap.                9.  Pat yourself dry with a clean towel.            10.  Wear clean pajamas.            11.  Place clean sheets on your bed the night of your first shower and do not  sleep with pets. Day of Surgery : Do not apply any lotions/deodorants the morning of surgery.  Please wear clean clothes to the hospital/surgery center.  FAILURE TO FOLLOW THESE INSTRUCTIONS MAY RESULT IN THE CANCELLATION OF YOUR SURGERY PATIENT SIGNATURE_________________________________  NURSE SIGNATURE__________________________________  ________________________________________________________________________

## 2021-04-17 ENCOUNTER — Other Ambulatory Visit: Payer: Self-pay

## 2021-04-17 ENCOUNTER — Encounter (INDEPENDENT_AMBULATORY_CARE_PROVIDER_SITE_OTHER): Payer: Self-pay

## 2021-04-17 ENCOUNTER — Encounter (HOSPITAL_COMMUNITY)
Admission: RE | Admit: 2021-04-17 | Discharge: 2021-04-17 | Disposition: A | Discharge status: Home / Self Care | Payer: Medicare Other | Source: Ambulatory Visit | Attending: Orthopedic Surgery | Admitting: Orthopedic Surgery

## 2021-04-17 ENCOUNTER — Encounter (HOSPITAL_COMMUNITY): Payer: Self-pay

## 2021-04-17 VITALS — BP 155/79 | HR 63 | Temp 98.2°F | Resp 16 | Ht 70.5 in | Wt 227.0 lb

## 2021-04-17 DIAGNOSIS — I10 Essential (primary) hypertension: Secondary | ICD-10-CM | POA: Diagnosis not present

## 2021-04-17 DIAGNOSIS — Z87891 Personal history of nicotine dependence: Secondary | ICD-10-CM | POA: Insufficient documentation

## 2021-04-17 DIAGNOSIS — Z96642 Presence of left artificial hip joint: Secondary | ICD-10-CM | POA: Diagnosis not present

## 2021-04-17 DIAGNOSIS — Z01818 Encounter for other preprocedural examination: Secondary | ICD-10-CM | POA: Diagnosis not present

## 2021-04-17 DIAGNOSIS — Z79899 Other long term (current) drug therapy: Secondary | ICD-10-CM | POA: Insufficient documentation

## 2021-04-17 DIAGNOSIS — M1611 Unilateral primary osteoarthritis, right hip: Secondary | ICD-10-CM | POA: Insufficient documentation

## 2021-04-17 DIAGNOSIS — Z8546 Personal history of malignant neoplasm of prostate: Secondary | ICD-10-CM | POA: Insufficient documentation

## 2021-04-17 HISTORY — DX: Malignant (primary) neoplasm, unspecified: C80.1

## 2021-04-17 HISTORY — DX: Unspecified osteoarthritis, unspecified site: M19.90

## 2021-04-17 HISTORY — DX: Other specified postprocedural states: Z98.890

## 2021-04-17 HISTORY — DX: Essential (primary) hypertension: I10

## 2021-04-17 HISTORY — DX: Other specified postprocedural states: R11.2

## 2021-04-17 LAB — COMPREHENSIVE METABOLIC PANEL
ALT: 28 U/L (ref 0–44)
AST: 24 U/L (ref 15–41)
Albumin: 4.3 g/dL (ref 3.5–5.0)
Alkaline Phosphatase: 38 U/L (ref 38–126)
Anion gap: 5 (ref 5–15)
BUN: 27 mg/dL — ABNORMAL HIGH (ref 8–23)
CO2: 26 mmol/L (ref 22–32)
Calcium: 9.2 mg/dL (ref 8.9–10.3)
Chloride: 105 mmol/L (ref 98–111)
Creatinine, Ser: 1.25 mg/dL — ABNORMAL HIGH (ref 0.61–1.24)
GFR, Estimated: >60 mL/min (ref >60)
Glucose, Bld: 107 mg/dL — ABNORMAL HIGH (ref 70–99)
Potassium: 5 mmol/L (ref 3.5–5.1)
Sodium: 136 mmol/L (ref 135–145)
Total Bilirubin: 0.8 mg/dL (ref 0.3–1.2)
Total Protein: 7.2 g/dL (ref 6.5–8.1)

## 2021-04-17 LAB — CBC
HCT: 43.7 % (ref 39.0–52.0)
Hemoglobin: 14.9 g/dL (ref 13.0–17.0)
MCH: 33.9 pg (ref 26.0–34.0)
MCHC: 34.1 g/dL (ref 30.0–36.0)
MCV: 99.5 fL (ref 80.0–100.0)
Platelets: 212 10*3/uL (ref 150–400)
RBC: 4.39 MIL/uL (ref 4.22–5.81)
RDW: 12.9 % (ref 11.5–15.5)
WBC: 7.3 10*3/uL (ref 4.0–10.5)
nRBC: 0 % (ref 0.0–0.2)

## 2021-04-17 LAB — SURGICAL PCR SCREEN
MRSA, PCR: NEGATIVE
Staphylococcus aureus: POSITIVE — AB

## 2021-04-17 NOTE — Progress Notes (Addendum)
Anesthesia Review:  PCP: Dr Maryella Shivers - requested clearance from [place]  Cardiologist : none  Chest x-ray : EKG : 04/17/21  Echo : Stress test: Cardiac Cath :  Activity level: cannot do a flight of stairs without difficulty  Sleep Study/ CPAP : none  Fasting Blood Sugar :      / Checks Blood Sugar -- times a day:   Blood Thinner/ Instructions /Last Dose: ASA / Instructions/ Last Dose :   Covid test- 04/23/21

## 2021-04-18 NOTE — Progress Notes (Signed)
Anesthesia Chart Review   Case: [id number] Date/Time: 04/26/21 0950   Procedure: TOTAL HIP ARTHROPLASTY ANTERIOR APPROACH (Right: Hip)   Anesthesia type: Spinal   Pre-op diagnosis: Right hip osteoarthritis   Location: [facility] ROOM 09 / [facility]   Surgeons: Paralee Cancel, MD       DISCUSSION:75 y.o. former smoker with h/o PONV, HTN, prostate cancer, right hip OA scheduled for above procedure 04/26/2021 with Dr. Paralee Cancel.   Anticipate pt can proceed with planned procedure barring acute status change.   VS: BP (!) 155/79   Pulse 63   Temp 36.8 C (Oral)   Resp 16   Ht 5' 10.5" (1.791 m)   Wt 103 kg   SpO2 96%   BMI 32.11 kg/m   PROVIDERS: Maryella Shivers, MD is PCP    LABS: Labs reviewed: Acceptable for surgery. (all labs ordered are listed, but only abnormal results are displayed)  Labs Reviewed  SURGICAL PCR SCREEN - Abnormal; Notable for the following components:      Result Value   Staphylococcus aureus POSITIVE (*)    All other components within normal limits  COMPREHENSIVE METABOLIC PANEL - Abnormal; Notable for the following components:   Glucose, Bld 107 (*)    BUN 27 (*)    Creatinine, Ser 1.25 (*)    All other components within normal limits  CBC  TYPE AND SCREEN     IMAGES:   EKG: 04/17/2021 Rate 61 bpm  NSR  CV:  Past Medical History:  Diagnosis Date   Arthritis    Cancer ([place])    PROSTATE CANCER   Elevated PSA    Hypertension    PONV (postoperative nausea and vomiting)     Past Surgical History:  Procedure Laterality Date   PROSTATECTOMY     TOTAL HIP ARTHROPLASTY Left     MEDICATIONS:  acetaminophen (TYLENOL) 500 MG tablet   ezetimibe (ZETIA) 10 MG tablet   fenofibrate (TRICOR) 145 MG tablet   gabapentin (NEURONTIN) 300 MG capsule   Multiple Vitamin (MULTIVITAMIN WITH MINERALS) TABS tablet   nebivolol (BYSTOLIC) 5 MG tablet   traMADol (ULTRAM) 50 MG tablet   traZODone (DESYREL) 50 MG tablet   valsartan (DIOVAN) 320 MG tablet    No current facility-administered medications for this encounter.      Konrad Felix Ward, PA-C [facility] Pre-Surgical Testing [phone]

## 2021-04-23 ENCOUNTER — Other Ambulatory Visit: Payer: Self-pay | Admitting: Orthopedic Surgery

## 2021-04-24 LAB — SARS CORONAVIRUS 2 (TAT 6-24 HRS): SARS Coronavirus 2: NEGATIVE

## 2021-04-25 DIAGNOSIS — M5126 Other intervertebral disc displacement, lumbar region: Secondary | ICD-10-CM | POA: Diagnosis not present

## 2021-04-25 DIAGNOSIS — M9903 Segmental and somatic dysfunction of lumbar region: Secondary | ICD-10-CM | POA: Diagnosis not present

## 2021-04-25 NOTE — H&P (Signed)
TOTAL HIP ADMISSION H&P  Patient is admitted for right total hip arthroplasty.  Subjective:  Chief Complaint: right hip pain  HPI: Alan Snyder, 75 y.o. male, has a history of pain and functional disability in the right hip(s) due to arthritis and patient has failed non-surgical conservative treatments for greater than 12 weeks to include NSAID's and/or analgesics and activity modification.  Onset of symptoms was gradual starting 2 years ago with gradually worsening course since that time.The patient noted no past surgery on the right hip(s).  Patient currently rates pain in the right hip at 8 out of 10 with activity. Patient has worsening of pain with activity and weight bearing and pain that interfers with activities of daily living. Patient has evidence of joint space narrowing by imaging studies. This condition presents safety issues increasing the risk of falls.  There is no current active infection.  Patient Active Problem List   Diagnosis Date Noted   Posterior vitreous detachment of right eye 08/14/2020   Posterior vitreous detachment of left eye 08/14/2020   Nuclear sclerotic cataract of both eyes 08/14/2020   Early stage nonexudative age-related macular degeneration of both eyes 08/14/2020   Lattice degeneration of peripheral retina, right 08/14/2020   Past Medical History:  Diagnosis Date   Arthritis    Cancer ([place])    PROSTATE CANCER   Elevated PSA    Hypertension    PONV (postoperative nausea and vomiting)     Past Surgical History:  Procedure Laterality Date   PROSTATECTOMY     TOTAL HIP ARTHROPLASTY Left     No current facility-administered medications for this encounter.   Current Outpatient Medications  Medication Sig Dispense Refill Last Dose   acetaminophen (TYLENOL) 500 MG tablet Take 500 mg by mouth every 6 (six) hours as needed for moderate pain.      ezetimibe (ZETIA) 10 MG tablet Take 10 mg by mouth daily.      fenofibrate (TRICOR) 145 MG tablet Take 145  mg by mouth daily.      gabapentin (NEURONTIN) 300 MG capsule Take 600-900 mg by mouth See admin instructions. Take 600 mg by mouth in the morning and 900 mg at night      Multiple Vitamin (MULTIVITAMIN WITH MINERALS) TABS tablet Take 1 tablet by mouth daily.      nebivolol (BYSTOLIC) 5 MG tablet Take 5 mg by mouth daily.      traMADol (ULTRAM) 50 MG tablet Take 50 mg by mouth 2 (two) times daily as needed for moderate pain.      traZODone (DESYREL) 50 MG tablet Take 50-150 mg by mouth at bedtime.      valsartan (DIOVAN) 320 MG tablet Take 320 mg by mouth daily.      Allergies  Allergen Reactions   Morphine And Related Itching    Social History   Tobacco Use   Smoking status: Former    Types: Cigarettes   Smokeless tobacco: Never  Substance Use Topics   Alcohol use: Yes    Comment: OCCASIONAL    No family history on file.   Review of Systems  Constitutional:  Negative for chills and fever.  Respiratory:  Negative for cough and shortness of breath.   Cardiovascular:  Negative for chest pain.  Gastrointestinal:  Negative for nausea and vomiting.  Musculoskeletal:  Positive for arthralgias.    Objective:  Physical Exam Well nourished and well developed. General: Alert and oriented x3, cooperative and pleasant, no acute distress. Head: normocephalic, atraumatic, neck  supple. Eyes: EOMI.  Musculoskeletal: Right hip exam: He has painful hip flexion internal rotation between 5 and 10 degrees with external rotation of 20 degrees Mild tenderness laterally Neurovascular intact other than his neuropathy based symptoms No lower extremity edema or erythema  Calves soft and nontender. Motor function intact in LE. Strength 5/5 LE bilaterally. Neuro: Distal pulses 2+. Sensation to light touch intact in LE.  Vital signs in last 24 hours:    Labs:   Estimated body mass index is 32.11 kg/m as calculated from the following:   Height as of 04/17/21: 5' 10.5" (1.791 m).   Weight  as of 04/17/21: 103 kg.   Imaging Review Plain radiographs demonstrate severe degenerative joint disease of the right hip(s). The bone quality appears to be adequate for age and reported activity level.      Assessment/Plan:  End stage arthritis, right hip(s)  The patient history, physical examination, clinical judgement of the provider and imaging studies are consistent with end stage degenerative joint disease of the right hip(s) and total hip arthroplasty is deemed medically necessary. The treatment options including medical management, injection therapy, arthroscopy and arthroplasty were discussed at length. The risks and benefits of total hip arthroplasty were presented and reviewed. The risks due to aseptic loosening, infection, stiffness, dislocation/subluxation,  thromboembolic complications and other imponderables were discussed.  The patient acknowledged the explanation, agreed to proceed with the plan and consent was signed. Patient is being admitted for inpatient treatment for surgery, pain control, PT, OT, prophylactic antibiotics, VTE prophylaxis, progressive ambulation and ADL's and discharge planning.The patient is planning to be discharged  home.  Therapy Plans: HEP Disposition: Home with wife Planned DVT Prophylaxis: aspirin 81mg  BID DME needed: none PCP: Dr. Nyra Capes, awaiting clearance TXA: IV Allergies: NKDA Anesthesia Concerns: none BMI: 32 Last HgbA1c: Not diabetic.  Other: - Retired Pharmacist, community - Norco, robaxin, Celebrex (200 mg daily) Don't send, - Had elevated creatinine recently - was using lots of ibuprofen + Celebrex 400 mg BID - NO FOLEY - in & out - has had prostatectomy, gets UTIs with [place], PA-C Orthopedic Surgery [facility] [place] [phone]

## 2021-04-26 ENCOUNTER — Observation Stay (HOSPITAL_COMMUNITY): Payer: Medicare Other

## 2021-04-26 ENCOUNTER — Observation Stay (HOSPITAL_COMMUNITY)
Admission: RE | Admit: 2021-04-26 | Discharge: 2021-04-27 | Disposition: A | Discharge status: Home / Self Care | Payer: Medicare Other | Source: Ambulatory Visit | Attending: Orthopedic Surgery | Admitting: Orthopedic Surgery

## 2021-04-26 ENCOUNTER — Ambulatory Visit (HOSPITAL_COMMUNITY): Payer: Medicare Other

## 2021-04-26 ENCOUNTER — Other Ambulatory Visit: Payer: Self-pay

## 2021-04-26 ENCOUNTER — Encounter (HOSPITAL_COMMUNITY)
Admission: RE | Disposition: A | Discharge status: Home / Self Care | Payer: Self-pay | Source: Ambulatory Visit | Attending: Orthopedic Surgery

## 2021-04-26 ENCOUNTER — Encounter (HOSPITAL_COMMUNITY): Payer: Self-pay | Admitting: Orthopedic Surgery

## 2021-04-26 ENCOUNTER — Ambulatory Visit (HOSPITAL_COMMUNITY): Payer: Medicare Other | Admitting: Certified Registered"

## 2021-04-26 ENCOUNTER — Ambulatory Visit (HOSPITAL_COMMUNITY): Payer: Medicare Other | Admitting: Physician Assistant

## 2021-04-26 DIAGNOSIS — M1611 Unilateral primary osteoarthritis, right hip: Principal | ICD-10-CM | POA: Insufficient documentation

## 2021-04-26 DIAGNOSIS — Z87891 Personal history of nicotine dependence: Secondary | ICD-10-CM | POA: Diagnosis not present

## 2021-04-26 DIAGNOSIS — S72041A Displaced fracture of base of neck of right femur, initial encounter for closed fracture: Secondary | ICD-10-CM

## 2021-04-26 DIAGNOSIS — Z79899 Other long term (current) drug therapy: Secondary | ICD-10-CM | POA: Insufficient documentation

## 2021-04-26 DIAGNOSIS — Z96641 Presence of right artificial hip joint: Secondary | ICD-10-CM | POA: Diagnosis not present

## 2021-04-26 DIAGNOSIS — Z471 Aftercare following joint replacement surgery: Secondary | ICD-10-CM | POA: Diagnosis not present

## 2021-04-26 DIAGNOSIS — Z96649 Presence of unspecified artificial hip joint: Secondary | ICD-10-CM

## 2021-04-26 DIAGNOSIS — Z8546 Personal history of malignant neoplasm of prostate: Secondary | ICD-10-CM | POA: Diagnosis not present

## 2021-04-26 DIAGNOSIS — M25761 Osteophyte, right knee: Secondary | ICD-10-CM | POA: Diagnosis not present

## 2021-04-26 DIAGNOSIS — I10 Essential (primary) hypertension: Secondary | ICD-10-CM | POA: Diagnosis not present

## 2021-04-26 HISTORY — PX: TOTAL HIP ARTHROPLASTY: SHX124

## 2021-04-26 LAB — TYPE AND SCREEN
ABO/RH(D): A POS
Antibody Screen: NEGATIVE

## 2021-04-26 LAB — ABO/RH: ABO/RH(D): A POS

## 2021-04-26 SURGERY — ARTHROPLASTY, HIP, TOTAL, ANTERIOR APPROACH
Anesthesia: Spinal | Site: Hip | Laterality: Right

## 2021-04-26 MED ORDER — ACETAMINOPHEN 325 MG PO TABS: 325.0000 mg | ORAL_TABLET | Freq: Once | ORAL | Status: DC | PRN

## 2021-04-26 MED ORDER — HYDROMORPHONE HCL 1 MG/ML IJ SOLN: 0.2500 mg | INTRAMUSCULAR | Status: DC | PRN

## 2021-04-26 MED ORDER — ONDANSETRON HCL 4 MG/2ML IJ SOLN: 4.0000 mg | Freq: Four times a day (QID) | INTRAMUSCULAR | Status: DC | PRN

## 2021-04-26 MED ORDER — STERILE WATER FOR IRRIGATION IR SOLN
Status: DC | PRN
  Administered 2021-04-26: 2000 mL

## 2021-04-26 MED ORDER — MENTHOL 3 MG MT LOZG: 1.0000 | LOZENGE | OROMUCOSAL | Status: DC | PRN

## 2021-04-26 MED ORDER — PHENYLEPHRINE HCL (PRESSORS) 10 MG/ML IV SOLN
INTRAVENOUS | Status: AC
  Filled 2021-04-26: qty 2

## 2021-04-26 MED ORDER — FENOFIBRATE 54 MG PO TABS
54.0000 mg | ORAL_TABLET | Freq: Every day | ORAL | Status: DC
  Administered 2021-04-26 – 2021-04-27 (×2): 54 mg via ORAL
  Filled 2021-04-26 (×2): qty 1

## 2021-04-26 MED ORDER — DOCUSATE SODIUM 100 MG PO CAPS
100.0000 mg | ORAL_CAPSULE | Freq: Two times a day (BID) | ORAL | Status: DC
  Administered 2021-04-26 – 2021-04-27 (×3): 100 mg via ORAL
  Filled 2021-04-26 (×3): qty 1

## 2021-04-26 MED ORDER — ACETAMINOPHEN 10 MG/ML IV SOLN: 1000.0000 mg | Freq: Once | INTRAVENOUS | Status: DC | PRN

## 2021-04-26 MED ORDER — GABAPENTIN 300 MG PO CAPS: 600.0000 mg | ORAL_CAPSULE | ORAL | Status: DC

## 2021-04-26 MED ORDER — ACETAMINOPHEN 325 MG PO TABS: 325.0000 mg | ORAL_TABLET | Freq: Four times a day (QID) | ORAL | Status: DC | PRN

## 2021-04-26 MED ORDER — POVIDONE-IODINE 10 % EX SWAB
2.0000 "application " | Freq: Once | CUTANEOUS | Status: AC
  Administered 2021-04-26: 2 via TOPICAL

## 2021-04-26 MED ORDER — CEFAZOLIN SODIUM-DEXTROSE 2-4 GM/100ML-% IV SOLN
2.0000 g | Freq: Four times a day (QID) | INTRAVENOUS | Status: AC
  Administered 2021-04-26 (×2): 2 g via INTRAVENOUS
  Filled 2021-04-26 (×2): qty 100

## 2021-04-26 MED ORDER — TRANEXAMIC ACID-NACL 1000-0.7 MG/100ML-% IV SOLN
1000.0000 mg | INTRAVENOUS | Status: AC
  Administered 2021-04-26: 1000 mg via INTRAVENOUS
  Filled 2021-04-26: qty 100

## 2021-04-26 MED ORDER — ONDANSETRON HCL 4 MG/2ML IJ SOLN
INTRAMUSCULAR | Status: DC | PRN
  Administered 2021-04-26: 4 mg via INTRAVENOUS

## 2021-04-26 MED ORDER — ASPIRIN 81 MG PO CHEW
81.0000 mg | CHEWABLE_TABLET | Freq: Two times a day (BID) | ORAL | Status: DC
  Administered 2021-04-26 – 2021-04-27 (×2): 81 mg via ORAL
  Filled 2021-04-26 (×2): qty 1

## 2021-04-26 MED ORDER — DIPHENHYDRAMINE HCL 12.5 MG/5ML PO ELIX: 12.5000 mg | ORAL_SOLUTION | ORAL | Status: DC | PRN

## 2021-04-26 MED ORDER — TRAZODONE HCL 50 MG PO TABS
50.0000 mg | ORAL_TABLET | Freq: Every day | ORAL | Status: DC
Start: 2021-04-26 — End: 2021-04-27
  Administered 2021-04-26: 150 mg via ORAL
  Filled 2021-04-26: qty 3

## 2021-04-26 MED ORDER — HYDROCODONE-ACETAMINOPHEN 7.5-325 MG PO TABS
1.0000 | ORAL_TABLET | ORAL | Status: DC | PRN
  Administered 2021-04-26: 2 via ORAL
  Administered 2021-04-26: 1 via ORAL
  Administered 2021-04-26 – 2021-04-27 (×2): 2 via ORAL
  Filled 2021-04-26 (×3): qty 2
  Filled 2021-04-26: qty 1
  Filled 2021-04-26: qty 2

## 2021-04-26 MED ORDER — BUPIVACAINE IN DEXTROSE 0.75-8.25 % IT SOLN
INTRATHECAL | Status: DC | PRN
  Administered 2021-04-26: 2 mL via INTRATHECAL

## 2021-04-26 MED ORDER — GABAPENTIN 300 MG PO CAPS
600.0000 mg | ORAL_CAPSULE | Freq: Every day | ORAL | Status: DC
  Administered 2021-04-27: 600 mg via ORAL
  Filled 2021-04-26: qty 2

## 2021-04-26 MED ORDER — METOCLOPRAMIDE HCL 5 MG/ML IJ SOLN: 5.0000 mg | Freq: Three times a day (TID) | INTRAMUSCULAR | Status: DC | PRN

## 2021-04-26 MED ORDER — LACTATED RINGERS IV SOLN: INTRAVENOUS | Status: DC

## 2021-04-26 MED ORDER — POLYETHYLENE GLYCOL 3350 17 G PO PACK: 17.0000 g | PACK | Freq: Every day | ORAL | Status: DC | PRN

## 2021-04-26 MED ORDER — MEPERIDINE HCL 50 MG/ML IJ SOLN: 6.2500 mg | INTRAMUSCULAR | Status: DC | PRN

## 2021-04-26 MED ORDER — PROPOFOL 500 MG/50ML IV EMUL
INTRAVENOUS | Status: DC | PRN
  Administered 2021-04-26: 150 ug/kg/min via INTRAVENOUS

## 2021-04-26 MED ORDER — ACETAMINOPHEN 160 MG/5ML PO SOLN: 325.0000 mg | Freq: Once | ORAL | Status: DC | PRN

## 2021-04-26 MED ORDER — PHENYLEPHRINE 40 MCG/ML (10ML) SYRINGE FOR IV PUSH (FOR BLOOD PRESSURE SUPPORT)
PREFILLED_SYRINGE | INTRAVENOUS | Status: DC | PRN
  Administered 2021-04-26 (×3): 80 ug via INTRAVENOUS

## 2021-04-26 MED ORDER — TRANEXAMIC ACID-NACL 1000-0.7 MG/100ML-% IV SOLN
1000.0000 mg | Freq: Once | INTRAVENOUS | Status: AC
  Administered 2021-04-26: 1000 mg via INTRAVENOUS
  Filled 2021-04-26: qty 100

## 2021-04-26 MED ORDER — METHOCARBAMOL 500 MG IVPB - SIMPLE MED
500.0000 mg | Freq: Four times a day (QID) | INTRAVENOUS | Status: DC | PRN
  Filled 2021-04-26: qty 50

## 2021-04-26 MED ORDER — ONDANSETRON HCL 4 MG PO TABS: 4.0000 mg | ORAL_TABLET | Freq: Four times a day (QID) | ORAL | Status: DC | PRN

## 2021-04-26 MED ORDER — CEFAZOLIN SODIUM-DEXTROSE 2-4 GM/100ML-% IV SOLN
2.0000 g | INTRAVENOUS | Status: AC
  Administered 2021-04-26: 2 g via INTRAVENOUS
  Filled 2021-04-26: qty 100

## 2021-04-26 MED ORDER — EZETIMIBE 10 MG PO TABS
10.0000 mg | ORAL_TABLET | Freq: Every day | ORAL | Status: DC
  Administered 2021-04-26 – 2021-04-27 (×2): 10 mg via ORAL
  Filled 2021-04-26 (×2): qty 1

## 2021-04-26 MED ORDER — GABAPENTIN 300 MG PO CAPS
900.0000 mg | ORAL_CAPSULE | Freq: Every day | ORAL | Status: DC
  Administered 2021-04-26: 900 mg via ORAL
  Filled 2021-04-26: qty 3

## 2021-04-26 MED ORDER — HYDROMORPHONE HCL 1 MG/ML IJ SOLN: 0.5000 mg | INTRAMUSCULAR | Status: DC | PRN

## 2021-04-26 MED ORDER — BISACODYL 10 MG RE SUPP
10.0000 mg | Freq: Every day | RECTAL | Status: DC | PRN
  Administered 2021-04-26: 10 mg via RECTAL
  Filled 2021-04-26: qty 1

## 2021-04-26 MED ORDER — PHENYLEPHRINE HCL-NACL 20-0.9 MG/250ML-% IV SOLN
INTRAVENOUS | Status: DC | PRN
  Administered 2021-04-26: 40 ug/min via INTRAVENOUS

## 2021-04-26 MED ORDER — PHENOL 1.4 % MT LIQD: 1.0000 | OROMUCOSAL | Status: DC | PRN

## 2021-04-26 MED ORDER — SODIUM CHLORIDE 0.9 % IV SOLN: INTRAVENOUS | Status: DC

## 2021-04-26 MED ORDER — METOCLOPRAMIDE HCL 5 MG PO TABS: 5.0000 mg | ORAL_TABLET | Freq: Three times a day (TID) | ORAL | Status: DC | PRN

## 2021-04-26 MED ORDER — 0.9 % SODIUM CHLORIDE (POUR BTL) OPTIME
TOPICAL | Status: DC | PRN
  Administered 2021-04-26: 1000 mL

## 2021-04-26 MED ORDER — NEBIVOLOL HCL 5 MG PO TABS
5.0000 mg | ORAL_TABLET | Freq: Every day | ORAL | Status: DC
  Administered 2021-04-26 – 2021-04-27 (×2): 5 mg via ORAL
  Filled 2021-04-26 (×2): qty 1

## 2021-04-26 MED ORDER — AMISULPRIDE (ANTIEMETIC) 5 MG/2ML IV SOLN: 10.0000 mg | Freq: Once | INTRAVENOUS | Status: DC | PRN

## 2021-04-26 MED ORDER — DEXAMETHASONE SODIUM PHOSPHATE 10 MG/ML IJ SOLN
8.0000 mg | Freq: Once | INTRAMUSCULAR | Status: AC
  Administered 2021-04-26: 8 mg via INTRAVENOUS

## 2021-04-26 MED ORDER — PROPOFOL 1000 MG/100ML IV EMUL
INTRAVENOUS | Status: AC
  Filled 2021-04-26: qty 200

## 2021-04-26 MED ORDER — METHOCARBAMOL 500 MG PO TABS
500.0000 mg | ORAL_TABLET | Freq: Four times a day (QID) | ORAL | Status: DC | PRN
  Administered 2021-04-26 – 2021-04-27 (×2): 500 mg via ORAL
  Filled 2021-04-26 (×2): qty 1

## 2021-04-26 MED ORDER — IRBESARTAN 150 MG PO TABS
300.0000 mg | ORAL_TABLET | Freq: Every day | ORAL | Status: DC
  Administered 2021-04-27: 300 mg via ORAL
  Filled 2021-04-26: qty 2

## 2021-04-26 MED ORDER — HYDROCODONE-ACETAMINOPHEN 5-325 MG PO TABS: 1.0000 | ORAL_TABLET | ORAL | Status: DC | PRN

## 2021-04-26 MED ORDER — PROMETHAZINE HCL 25 MG/ML IJ SOLN: 6.2500 mg | INTRAMUSCULAR | Status: DC | PRN

## 2021-04-26 MED ORDER — DEXAMETHASONE SODIUM PHOSPHATE 10 MG/ML IJ SOLN
10.0000 mg | Freq: Once | INTRAMUSCULAR | Status: AC
  Administered 2021-04-27: 10 mg via INTRAVENOUS
  Filled 2021-04-26: qty 1

## 2021-04-26 MED ORDER — FERROUS SULFATE 325 (65 FE) MG PO TABS
325.0000 mg | ORAL_TABLET | Freq: Three times a day (TID) | ORAL | Status: DC
  Administered 2021-04-26 – 2021-04-27 (×2): 325 mg via ORAL
  Filled 2021-04-26 (×2): qty 1

## 2021-04-26 SURGICAL SUPPLY — 40 items
BAG COUNTER SPONGE SURGICOUNT (BAG) IMPLANT
BAG DECANTER FOR FLEXI CONT (MISCELLANEOUS) IMPLANT
BAG ZIPLOCK 12X15 (MISCELLANEOUS) IMPLANT
BLADE SAG 18X100X1.27 (BLADE) ×2 IMPLANT
COVER PERINEAL POST (MISCELLANEOUS) ×2 IMPLANT
COVER SURGICAL LIGHT HANDLE (MISCELLANEOUS) ×2 IMPLANT
CUP ACETBLR 54 OD PINNACLE (Hips) ×2 IMPLANT
DERMABOND ADVANCED (GAUZE/BANDAGES/DRESSINGS) ×1
DERMABOND ADVANCED .7 DNX12 (GAUZE/BANDAGES/DRESSINGS) ×1 IMPLANT
DRAPE FOOT SWITCH (DRAPES) ×2 IMPLANT
DRAPE STERI IOBAN 125X83 (DRAPES) ×2 IMPLANT
DRAPE U-SHAPE 47X51 STRL (DRAPES) ×4 IMPLANT
DRESSING AQUACEL AG SP 3.5X10 (GAUZE/BANDAGES/DRESSINGS) ×1 IMPLANT
DRSG AQUACEL AG ADV 3.5X10 (GAUZE/BANDAGES/DRESSINGS) ×2 IMPLANT
DRSG AQUACEL AG SP 3.5X10 (GAUZE/BANDAGES/DRESSINGS) ×2
DURAPREP 26ML APPLICATOR (WOUND CARE) ×2 IMPLANT
ELECT REM PT RETURN 15FT ADLT (MISCELLANEOUS) ×2 IMPLANT
ELIMINATOR HOLE APEX DEPUY (Hips) ×2 IMPLANT
GLOVE SURG ENC MOIS LTX SZ6 (GLOVE) ×4 IMPLANT
GLOVE SURG ENC MOIS LTX SZ7 (GLOVE) ×2 IMPLANT
GLOVE SURG POLY ORTHO LF SZ6.5 (GLOVE) ×2 IMPLANT
GLOVE SURG UNDER POLY LF SZ7.5 (GLOVE) ×2 IMPLANT
GOWN STRL REUS W/TWL LRG LVL3 (GOWN DISPOSABLE) ×4 IMPLANT
HEAD CERAMIC 36 PLUS5 (Hips) ×2 IMPLANT
HOLDER FOLEY CATH W/STRAP (MISCELLANEOUS) ×2 IMPLANT
KIT TURNOVER KIT A (KITS) IMPLANT
LINER NEUTRAL 54X36MM PLUS 4 (Hips) ×2 IMPLANT
PACK ANTERIOR HIP CUSTOM (KITS) ×2 IMPLANT
PENCIL SMOKE EVACUATOR (MISCELLANEOUS) IMPLANT
SCREW 6.5MMX30MM (Screw) ×2 IMPLANT
STEM FEM ACTIS HIGH SZ8 (Stem) ×2 IMPLANT
SUT MNCRL AB 4-0 PS2 18 (SUTURE) ×2 IMPLANT
SUT STRATAFIX 0 PDS 27 VIOLET (SUTURE) ×2
SUT VIC AB 1 CT1 36 (SUTURE) ×6 IMPLANT
SUT VIC AB 2-0 CT1 27 (SUTURE) ×2
SUT VIC AB 2-0 CT1 TAPERPNT 27 (SUTURE) ×2 IMPLANT
SUTURE STRATFX 0 PDS 27 VIOLET (SUTURE) ×1 IMPLANT
TRAY FOLEY MTR SLVR 16FR STAT (SET/KITS/TRAYS/PACK) IMPLANT
TUBE SUCTION HIGH CAP CLEAR NV (SUCTIONS) ×2 IMPLANT
WATER STERILE IRR 1000ML POUR (IV SOLUTION) ×2 IMPLANT

## 2021-04-26 NOTE — Progress Notes (Signed)
Orthopedic Tech Progress Note Patient Details:  Alan Snyder 1945/07/08 [id number]  Patient ID: Alan Snyder, male   DOB: [birth year], 75 y.o.   MRN: [id number] No OHF; pt is over age limit. Vernona Rieger 04/26/2021, 2:27 PM

## 2021-04-26 NOTE — Plan of Care (Signed)
  Problem: Activity: Goal: Ability to avoid complications of mobility impairment will improve Outcome: Progressing Goal: Ability to tolerate increased activity will improve Outcome: Progressing   Problem: Pain Management: Goal: Pain level will decrease with appropriate interventions Outcome: Progressing   

## 2021-04-26 NOTE — Discharge Instructions (Signed)

## 2021-04-26 NOTE — Care Plan (Signed)
Ortho Bundle Case Management Note  Patient Details  Name: Alan Snyder MRN: [id number] Date of Birth: [birth year]  R THA on 04-26-21 DCP:  Home with wife.  2 story home with 2 ste. DME:  No needs.  Has a RW and 3-in-1. PT:  HEP                DME Arranged:  N/A DME Agency:  NA  HH Arranged:  NA HH Agency:  NA  Additional Comments: Please contact me with any questions of if this plan should need to change.  Marianne Sofia, RN,CCM [facility]  [phone] 04/26/2021, 2:31 PM

## 2021-04-26 NOTE — Evaluation (Signed)
Physical Therapy Evaluation Patient Details Name: Alan Snyder MRN: [id number] DOB: 29-Aug-1945 Today's Date: 04/26/2021  History of Present Illness  Pt is 75 yo male s/p R anterior THA on 04/26/21.  Pt with hx including but not limited to arthritis, prostate CA, HTN, L THA  Clinical Impression  Pt is s/p anterior THA resulting in the deficits listed below (see PT Problem List). At baseline, pt is independent.  He lives in 2 level home, has support, and has DME.  Today, pt limited due to still has some numbness/decreased light touch associated with spinal block.  He transferred to EOB with min A and took side steps toward [facility].  Pt expected to progress very well. Pt will benefit from skilled PT to increase their independence and safety with mobility to allow discharge to the venue listed below.         Recommendations for follow up therapy are one component of a multi-disciplinary discharge planning process, led by the attending physician.  Recommendations may be updated based on patient status, additional functional criteria and insurance authorization.  Follow Up Recommendations Follow physician's recommendations for discharge plan and follow up therapies    Assistance Recommended at Discharge Intermittent Supervision/Assistance  Functional Status Assessment Patient has had a recent decline in their functional status and demonstrates the ability to make significant improvements in function in a reasonable and predictable amount of time.  Equipment Recommendations  None recommended by PT    Recommendations for Other Services       Precautions / Restrictions Precautions Precautions: Fall Restrictions Weight Bearing Restrictions: Yes RLE Weight Bearing: Weight bearing as tolerated      Mobility  Bed Mobility Overal bed mobility: Needs Assistance Bed Mobility: Supine to Sit;Sit to Supine     Supine to sit: Min assist Sit to supine: Min assist   General bed mobility comments: Min  A for R LE    Transfers Overall transfer level: Needs assistance Equipment used: Rolling walker (2 wheels) Transfers: Sit to/from Stand Sit to Stand: Min assist           General transfer comment: min A to steady; cues for hand placement    Ambulation/Gait Ambulation/Gait assistance: Min assist Gait Distance (Feet): 3 Feet Assistive device: Rolling walker (2 wheels) Gait Pattern/deviations: Step-to pattern;Decreased stride length Gait velocity: decreased   General Gait Details: Side steps toward HOB; min A to steady; limited due to decreased sensation  Stairs            Wheelchair Mobility    Modified Rankin (Stroke Patients Only)       Balance Overall balance assessment: Needs assistance Sitting-balance support: No upper extremity supported Sitting balance-Leahy Scale: Good     Standing balance support: Bilateral upper extremity supported Standing balance-Leahy Scale: Poor Standing balance comment: requiring RW                             Pertinent Vitals/Pain Pain Assessment: 0-10 Pain Score: 1  Pain Location: R hip Pain Descriptors / Indicators: Discomfort Pain Intervention(s): Limited activity within patient's tolerance;Monitored during session    Home Living Family/patient expects to be discharged to:: Private residence Living Arrangements: Spouse/significant other (wife and father in [employer]) Available Help at Discharge: Family;Available 24 hours/day Type of Home: House Home Access: Stairs to enter   Entrance Stairs-Number of Steps: 1 Alternate Level Stairs-Number of Steps: 21 Home Layout: Two level;Bed/bath upstairs;Full bath on main level Home Equipment: Shower seat -  built Medical sales representative (2 wheels);Toilet riser      Prior Function Prior Level of Function : Independent/Modified Independent;Driving             Mobility Comments: Able to ambulate in community but reports difficult due to hip ADLs Comments: Independent with  all; occasionally still works as Arts administrator        Extremity/Trunk Assessment   Upper Extremity Assessment Upper Extremity Assessment: Overall WFL for tasks assessed    Lower Extremity Assessment Lower Extremity Assessment: RLE deficits/detail;LLE deficits/detail RLE Deficits / Details: ROM WFL; MMT ankle 5/5, knee 5/5 (light pressure), hip 2/5 RLE Sensation: decreased light touch;history of peripheral neuropathy (Decreased in bil feet but reports still further decreased from spinal up to knee, once sitting EOB stated buttock too) LLE Deficits / Details: ROM WFL; MMT 5/5 LLE Sensation: decreased light touch;history of peripheral neuropathy (Decreased in bil feet but reports still further decreased from spinal up to knee, once sitting EOB stated buttock too)    Cervical / Trunk Assessment Cervical / Trunk Assessment: Normal  Communication   Communication: No difficulties  Cognition Arousal/Alertness: Awake/alert Behavior During Therapy: WFL for tasks assessed/performed Overall Cognitive Status: Within Functional Limits for tasks assessed                                          General Comments      Exercises     Assessment/Plan    PT Assessment Patient needs continued PT services  PT Problem List Decreased strength;Decreased mobility;Decreased safety awareness;Decreased range of motion;Decreased activity tolerance;Decreased balance;Decreased knowledge of use of DME;Pain       PT Treatment Interventions DME instruction;Therapeutic activities;Modalities;Gait training;Therapeutic exercise;Patient/family education;Stair training;Balance training;Functional mobility training    PT Goals (Current goals can be found in the Care Plan section)  Acute Rehab PT Goals Patient Stated Goal: return home PT Goal Formulation: With patient/family Time For Goal Achievement: 05/10/21 Potential to Achieve Goals: Good    Frequency 7X/week   Barriers  to discharge        Co-evaluation               AM-PAC PT "6 Clicks" Mobility  Outcome Measure Help needed turning from your back to your side while in a flat bed without using bedrails?: A Little Help needed moving from lying on your back to sitting on the side of a flat bed without using bedrails?: A Little Help needed moving to and from a bed to a chair (including a wheelchair)?: A Little Help needed standing up from a chair using your arms (e.g., wheelchair or bedside chair)?: A Little Help needed to walk in hospital room?: A Little Help needed climbing 3-5 steps with a railing? : A Little 6 Click Score: 18    End of Session Equipment Utilized During Treatment: Gait belt Activity Tolerance: Patient tolerated treatment well Patient left: in bed;with call bell/phone within reach;with SCD's reapplied;with bed alarm set;with family/visitor present Nurse Communication: Mobility status PT Visit Diagnosis: Other abnormalities of gait and mobility (R26.89)    Time: 4166-0630 PT Time Calculation (min) (ACUTE ONLY): 26 min   Charges:   PT Evaluation $PT Eval Low Complexity: 1 Low PT Treatments $Therapeutic Activity: 8-22 mins        Abran Richard, PT Acute Rehab Services Pager [phone] [facility] [phone]   Alan Snyder 04/26/2021, 3:47  PM

## 2021-04-26 NOTE — Transfer of Care (Signed)
Immediate Anesthesia Transfer of Care Note  Patient: Weber Cooks  Procedure(s) Performed: TOTAL HIP ARTHROPLASTY ANTERIOR APPROACH (Right: Hip)  Patient Location: PACU  Anesthesia Type:Spinal and MAC combined with regional for post-op pain  Level of Consciousness: sedated  Airway & Oxygen Therapy: Patient connected to face mask oxygen  Post-op Assessment: Report given to RN and Post -op Vital signs reviewed and stable  Post vital signs: stable  Last Vitals:  Vitals Value Taken Time  BP 116/60 04/26/21 1151  Temp    Pulse 60 04/26/21 1153  Resp 17 04/26/21 1153  SpO2 97 % 04/26/21 1153  Vitals shown include unvalidated device data.  Last Pain:  Vitals:   04/26/21 0810  TempSrc:   PainSc: 3          Complications: No notable events documented.

## 2021-04-26 NOTE — Interval H&P Note (Signed)
History and Physical Interval Note:  04/26/2021 8:33 AM  Alan Snyder  has presented today for surgery, with the diagnosis of Right hip osteoarthritis.  The various methods of treatment have been discussed with the patient and family. After consideration of risks, benefits and other options for treatment, the patient has consented to  Procedure(s): TOTAL HIP ARTHROPLASTY ANTERIOR APPROACH (Right) as a surgical intervention.  The patient's history has been reviewed, patient examined, no change in status, stable for surgery.  I have reviewed the patient's chart and labs.  Questions were answered to the patient's satisfaction.     Mauri Pole

## 2021-04-26 NOTE — Anesthesia Preprocedure Evaluation (Addendum)
Anesthesia Evaluation  Patient identified by MRN, date of birth, ID band Patient awake    Reviewed: Allergy & Precautions, NPO status , Patient's Chart, lab work & pertinent test results  History of Anesthesia Complications (+) PONV and history of anesthetic complications  Airway Mallampati: II  TM Distance: >3 FB Neck ROM: Full    Dental  (+) Teeth Intact, Dental Advisory Given   Pulmonary former smoker,    breath sounds clear to auscultation       Cardiovascular hypertension, Pt. on medications  Rhythm:Regular Rate:Normal     Neuro/Psych negative neurological ROS  negative psych ROS   GI/Hepatic negative GI ROS, Neg liver ROS,   Endo/Other  negative endocrine ROS  Renal/GU negative Renal ROS     Musculoskeletal  (+) Arthritis ,   Abdominal Normal abdominal exam  (+)   Peds  Hematology negative hematology ROS (+)   Anesthesia Other Findings   Reproductive/Obstetrics                            Anesthesia Physical Anesthesia Plan  ASA: 2  Anesthesia Plan: Spinal   Post-op Pain Management:    Induction: Intravenous  PONV Risk Score and Plan: 4 or greater and Ondansetron, Dexamethasone and Propofol infusion  Airway Management Planned: Natural Airway and Simple Face Mask  Additional Equipment: None  Intra-op Plan:   Post-operative Plan:   Informed Consent: I have reviewed the patients History and Physical, chart, labs and discussed the procedure including the risks, benefits and alternatives for the proposed anesthesia with the patient or authorized representative who has indicated his/her understanding and acceptance.     Dental advisory given  Plan Discussed with: CRNA  Anesthesia Plan Comments:        Anesthesia Quick Evaluation

## 2021-04-26 NOTE — Op Note (Signed)
NAME:  Alan Snyder                ACCOUNT NO.: [id number]      MEDICAL RECORD NO.: [id number]      FACILITY:  [facility]      PHYSICIAN:  Mauri Pole  DATE OF BIRTH:  19-Oct-1945     DATE OF PROCEDURE:  04/26/2021                                 OPERATIVE REPORT         PREOPERATIVE DIAGNOSIS: Right  hip osteoarthritis.      POSTOPERATIVE DIAGNOSIS:  Right hip osteoarthritis.      PROCEDURE:  Right total hip replacement through an anterior approach   utilizing DePuy THR system, component size 54 mm pinnacle cup, a size 36+4 neutral   Altrex liner, a size 8 Hi Actis stem with a 36+5 delta ceramic   ball.      SURGEON:  Pietro Cassis. Alvan Dame, M.D.      ASSISTANT:  Costella Hatcher, PA-C     ANESTHESIA:  Spinal.      SPECIMENS:  None.      COMPLICATIONS:  None.      BLOOD LOSS:  300 cc     DRAINS:  None.      INDICATION OF THE PROCEDURE:  Alan Snyder is a 75 y.o. male who had   presented to office for evaluation of right hip pain.  Radiographs revealed   progressive degenerative changes with bone-on-bone   articulation of the  hip joint, including subchondral cystic changes and osteophytes.  The patient had painful limited range of   motion significantly affecting their overall quality of life and function.  The patient was failing to    respond to conservative measures including medications and/or injections and activity modification and at this point was ready   to proceed with more definitive measures.  Consent was obtained for   benefit of pain relief.  Specific risks of infection, DVT, component   failure, dislocation, neurovascular injury, and need for revision surgery were reviewed in the office as well discussion of   the anterior versus posterior approach were reviewed.     PROCEDURE IN DETAIL:  The patient was brought to operative theater.   Once adequate anesthesia, preoperative antibiotics, 2 gm of Ancef, 1 gm of Tranexamic Acid, and 10 mg of  Decadron were administered, the patient was positioned supine on the [employer] table.  Once the patient was safely positioned with adequate padding of boney prominences we predraped out the hip, and used fluoroscopy to confirm orientation of the pelvis.      The right hip was then prepped and draped from proximal iliac crest to   mid thigh with a shower curtain technique.      Time-out was performed identifying the patient, planned procedure, and the appropriate extremity.     An incision was then made 2 cm lateral to the   anterior superior iliac spine extending over the orientation of the   tensor fascia lata muscle and sharp dissection was carried down to the   fascia of the muscle.      The fascia was then incised.  The muscle belly was identified and swept   laterally and retractor placed along the superior neck.  Following   cauterization of the circumflex vessels and removing some pericapsular  fat, a second cobra retractor was placed on the inferior neck.  A T-capsulotomy was made along the line of the   superior neck to the trochanteric fossa, then extended proximally and   distally.  Tag sutures were placed and the retractors were then placed   intracapsular.  We then identified the trochanteric fossa and   orientation of my neck cut and then made a neck osteotomy with the femur on traction.  The femoral   head was removed without difficulty or complication.  Traction was let   off and retractors were placed posterior and anterior around the   acetabulum.      The labrum and foveal tissue were debrided.  I began reaming with a 47 mm   reamer and reamed up to 53 mm reamer with good bony bed preparation and a 54 mm  cup was chosen.  The final 54 mm Pinnacle cup was then impacted under fluoroscopy to confirm the depth of penetration and orientation with respect to   Abduction and forward flexion.  A screw was placed into the ilium followed by the hole eliminator.  The final   36+4  neutral Altrex liner was impacted with good visualized rim fit.  The cup was positioned anatomically within the acetabular portion of the pelvis.      At this point, the femur was rolled to 100 degrees.  Further capsule was   released off the inferior aspect of the femoral neck.  I then   released the superior capsule proximally.  With the leg in a neutral position the hook was placed laterally   along the femur under the vastus lateralis origin and elevated manually and then held in position using the hook attachment on the bed.  The leg was then extended and adducted with the leg rolled to 100   degrees of external rotation.  Retractors were placed along the medial calcar and posteriorly over the greater trochanter.  Once the proximal femur was fully   exposed, I used a box osteotome to set orientation.  I then began   broaching with the starting chili pepper broach and passed this by hand and then broached up to 8.  With the 8 broach in place I chose a high offset neck and did several trial reductions.  The offset was appropriate, leg lengths   appeared to be equal best matched with the +5 head ball trial confirmed radiographically.   Given these findings, I went ahead and dislocated the hip, repositioned all   retractors and positioned the right hip in the extended and abducted position.  The final 8 Hi Actis stem was   chosen and it was impacted down to the level of neck cut.  Based on this   and the trial reductions, a final 36+5 delta ceramic ball was chosen and   impacted onto a clean and dry trunnion, and the hip was reduced.  The   hip had been irrigated throughout the case again at this point.  I did   reapproximate the superior capsular leaflet to the anterior leaflet   using #1 Vicryl.  The fascia of the   tensor fascia lata muscle was then reapproximated using #1 Vicryl and #0 Stratafix sutures.  The   remaining wound was closed with 2-0 Vicryl and running 4-0 Monocryl.   The hip  was cleaned, dried, and dressed sterilely using Dermabond and   Aquacel dressing.  The patient was then brought   to recovery room in  stable condition tolerating the procedure well.    Costella Hatcher, PA-C was present for the entirety of the case involved from   preoperative positioning, perioperative retractor management, general   facilitation of the case, as well as primary wound closure as assistant.            Pietro Cassis Alvan Dame, M.D.        04/26/2021 10:15 AM

## 2021-04-26 NOTE — Anesthesia Procedure Notes (Signed)
Spinal  Start time: 04/26/2021 10:13 AM End time: 04/26/2021 10:17 AM Reason for block: surgical anesthesia Staffing Performed: anesthesiologist  Anesthesiologist: Effie Berkshire, MD Preanesthetic Checklist Completed: patient identified, IV checked, site marked, risks and benefits discussed, surgical consent, monitors and equipment checked, pre-op evaluation and timeout performed Spinal Block Patient position: sitting Prep: DuraPrep and site prepped and draped Location: L3-4 Injection technique: single-shot Needle Needle type: Pencan  Needle gauge: 24 G Needle length: 10 cm Needle insertion depth: 10 cm Assessment Events: CSF return Additional Notes Patient tolerated well. No immediate complications.

## 2021-04-26 NOTE — Anesthesia Postprocedure Evaluation (Signed)
Anesthesia Post Note  Patient: Alan Snyder  Procedure(s) Performed: TOTAL HIP ARTHROPLASTY ANTERIOR APPROACH (Right: Hip)     Patient location during evaluation: PACU Anesthesia Type: Spinal Level of consciousness: oriented and awake and alert Pain management: pain level controlled Vital Signs Assessment: post-procedure vital signs reviewed and stable Respiratory status: spontaneous breathing, respiratory function stable and patient connected to nasal cannula oxygen Cardiovascular status: blood pressure returned to baseline and stable Postop Assessment: no headache, no backache, no apparent nausea or vomiting and spinal receding Anesthetic complications: no   No notable events documented.  Last Vitals:  Vitals:   04/26/21 1300 04/26/21 1315  BP: (!) 123/59 (!) 111/41  Pulse: (!) 58 (!) 58  Resp: 13 13  Temp:  (!) 36.3 C  SpO2: 100% 100%    Last Pain:  Vitals:   04/26/21 1315  TempSrc:   PainSc: 0-No pain    LLE Motor Response: Purposeful movement (04/26/21 1315) LLE Sensation: Decreased;Numbness (04/26/21 1315) RLE Motor Response: Purposeful movement (04/26/21 1315) RLE Sensation: Decreased;Numbness (04/26/21 1315) L Sensory Level: L3-Anterior knee, lower leg (04/26/21 1315) R Sensory Level: L3-Anterior knee, lower leg (04/26/21 1315)  Effie Berkshire

## 2021-04-27 DIAGNOSIS — Z79899 Other long term (current) drug therapy: Secondary | ICD-10-CM | POA: Diagnosis not present

## 2021-04-27 DIAGNOSIS — M1611 Unilateral primary osteoarthritis, right hip: Secondary | ICD-10-CM | POA: Diagnosis not present

## 2021-04-27 DIAGNOSIS — Z87891 Personal history of nicotine dependence: Secondary | ICD-10-CM | POA: Diagnosis not present

## 2021-04-27 DIAGNOSIS — I10 Essential (primary) hypertension: Secondary | ICD-10-CM | POA: Diagnosis not present

## 2021-04-27 LAB — CBC
HCT: 40 % (ref 39.0–52.0)
Hemoglobin: 13.5 g/dL (ref 13.0–17.0)
MCH: 33.3 pg (ref 26.0–34.0)
MCHC: 33.8 g/dL (ref 30.0–36.0)
MCV: 98.8 fL (ref 80.0–100.0)
Platelets: 204 10*3/uL (ref 150–400)
RBC: 4.05 MIL/uL — ABNORMAL LOW (ref 4.22–5.81)
RDW: 12.7 % (ref 11.5–15.5)
WBC: 17.3 10*3/uL — ABNORMAL HIGH (ref 4.0–10.5)
nRBC: 0 % (ref 0.0–0.2)

## 2021-04-27 LAB — BASIC METABOLIC PANEL
Anion gap: 7 (ref 5–15)
BUN: 27 mg/dL — ABNORMAL HIGH (ref 8–23)
CO2: 26 mmol/L (ref 22–32)
Calcium: 9 mg/dL (ref 8.9–10.3)
Chloride: 105 mmol/L (ref 98–111)
Creatinine, Ser: 1.13 mg/dL (ref 0.61–1.24)
GFR, Estimated: >60 mL/min (ref >60)
Glucose, Bld: 171 mg/dL — ABNORMAL HIGH (ref 70–99)
Potassium: 4.3 mmol/L (ref 3.5–5.1)
Sodium: 138 mmol/L (ref 135–145)

## 2021-04-27 MED ORDER — ASPIRIN 81 MG PO CHEW: 81.0000 mg | CHEWABLE_TABLET | Freq: Two times a day (BID) | ORAL | 0 refills | Status: AC

## 2021-04-27 MED ORDER — POLYETHYLENE GLYCOL 3350 17 G PO PACK: 17.0000 g | PACK | Freq: Every day | ORAL | 0 refills | Status: DC | PRN

## 2021-04-27 MED ORDER — HYDROCODONE-ACETAMINOPHEN 5-325 MG PO TABS
1.0000 | ORAL_TABLET | Freq: Four times a day (QID) | ORAL | 0 refills | Status: DC | PRN
Start: 2021-04-27 — End: 2021-12-27

## 2021-04-27 MED ORDER — METHOCARBAMOL 500 MG PO TABS: 500.0000 mg | ORAL_TABLET | Freq: Four times a day (QID) | ORAL | 0 refills | Status: DC | PRN

## 2021-04-27 MED ORDER — DOCUSATE SODIUM 100 MG PO CAPS
100.0000 mg | ORAL_CAPSULE | Freq: Two times a day (BID) | ORAL | 0 refills | Status: AC
Start: 2021-04-27 — End: ?

## 2021-04-27 NOTE — Plan of Care (Signed)
  Problem: Education: Goal: Knowledge of the prescribed therapeutic regimen will improve Outcome: Progressing   Problem: Activity: Goal: Ability to avoid complications of mobility impairment will improve Outcome: Progressing   Problem: Pain Management: Goal: Pain level will decrease with appropriate interventions Outcome: Progressing   

## 2021-04-27 NOTE — Plan of Care (Signed)
Patient discharged.

## 2021-04-27 NOTE — Progress Notes (Signed)
Physical Therapy Treatment Patient Details Name: Alan Snyder MRN: [id number] DOB: 05/28/1946 Today's Date: 04/27/2021   History of Present Illness Pt is 75 yo male s/p R anterior THA on 04/26/21.  Pt with hx including but not limited to arthritis, prostate CA, HTN, L THA    PT Comments     Patient is POD #1 s/p R THA. Patient is now limited by functional impairments (see PT problem list below) and requires MIN guard-supervision for transfers and gait with RW. Patient was able to ambulate ~160 feet with RW and MIN guard-close supervision and cues for safe walker management. Patient educated on safe sequencing for stair mobility and verbalized safe guarding position for family members when assisting. Patient instructed in exercises to facilitate ROM, strength, and circulation. Pt will have 24hr assist from family. Patient will benefit from continued skilled PT interventions to address impairments and progress towards PLOF. Patient has met mobility goals at adequate level for discharge home.    Recommendations for follow up therapy are one component of a multi-disciplinary discharge planning process, led by the attending physician.  Recommendations may be updated based on patient status, additional functional criteria and insurance authorization.  Follow Up Recommendations  Follow physician's recommendations for discharge plan and follow up therapies     Assistance Recommended at Discharge Intermittent Supervision/Assistance  Equipment Recommendations  None recommended by PT    Recommendations for Other Services       Precautions / Restrictions Precautions Precautions: Fall Restrictions Weight Bearing Restrictions: Yes RLE Weight Bearing: Weight bearing as tolerated     Mobility  Bed Mobility Overal bed mobility: Needs Assistance Bed Mobility: Supine to Sit;Sit to Supine     Supine to sit: Min assist;HOB elevated Sit to supine: Min assist;HOB elevated   General bed mobility  comments: Min A for R LE onto/off of bed    Transfers Overall transfer level: Needs assistance Equipment used: Rolling walker (2 wheels) Transfers: Sit to/from Stand Sit to Stand: Min guard           General transfer comment: x2 from EOB; demonstrated cues for safe hand placement    Ambulation/Gait Ambulation/Gait assistance: Min guard;Supervision Gait Distance (Feet): 160 Feet Assistive device: Rolling walker (2 wheels) Gait Pattern/deviations: Step-to pattern;Step-through pattern;Decreased stride length;Decreased weight shift to right Gait velocity: decreased   General Gait Details: cues for step to progressing to step through with no LOB.   Stairs Stairs: Yes Stairs assistance: Min assist Stair Management: No rails;Forwards;With walker Number of Stairs: 5 (and 1 curb step) General stair comments: pt performed safe stair negotation with 1 curb step and with mobile stair case as pt reports he has both at home and was unsure if entry way would be wide enough to accomodate RW on the 1 step up entry. PT educated pt on sequencing "up with the good, down with the bad" and proper positioning for family members when assisting at home. Pt demonstrated understanding and verbalized comfort with performance of stair negotation. (pt has 2 level home but will be staying on main level upon d/c during recovery)   Wheelchair Mobility    Modified Rankin (Stroke Patients Only)       Balance Overall balance assessment: Needs assistance Sitting-balance support: No upper extremity supported Sitting balance-Leahy Scale: Good     Standing balance support: Bilateral upper extremity supported;During functional activity Standing balance-Leahy Scale: Poor Standing balance comment: requiring RW                              Cognition Arousal/Alertness: Awake/alert Behavior During Therapy: WFL for tasks assessed/performed Overall Cognitive Status: Within Functional Limits for tasks  assessed                                          Exercises Total Joint Exercises Ankle Circles/Pumps: AROM;10 reps;Both;Supine Quad Sets: AROM;Right;10 reps;Supine Short Arc Quad: AROM;Right;10 reps;Supine Heel Slides: AROM;Right;5 reps;Supine Hip ABduction/ADduction: AROM;Right;10 reps;Supine Long Arc Quad: AROM;Right;10 reps;Seated    General Comments        Pertinent Vitals/Pain Pain Assessment: 0-10 Pain Score: 4  Pain Location: R hip Pain Descriptors / Indicators: Sore Pain Intervention(s): Limited activity within patient's tolerance;Monitored during session;Repositioned;Premedicated before session    Home Living                          Prior Function            PT Goals (current goals can now be found in the care plan section) Acute Rehab PT Goals Patient Stated Goal: return home PT Goal Formulation: With patient/family Time For Goal Achievement: 05/10/21 Potential to Achieve Goals: Good Progress towards PT goals: Progressing toward goals    Frequency    7X/week      PT Plan Current plan remains appropriate    Co-evaluation              AM-PAC PT "6 Clicks" Mobility   Outcome Measure  Help needed turning from your back to your side while in a flat bed without using bedrails?: None Help needed moving from lying on your back to sitting on the side of a flat bed without using bedrails?: A Little Help needed moving to and from a bed to a chair (including a wheelchair)?: A Little Help needed standing up from a chair using your arms (e.g., wheelchair or bedside chair)?: A Little Help needed to walk in hospital room?: A Little Help needed climbing 3-5 steps with a railing? : A Little 6 Click Score: 19    End of Session Equipment Utilized During Treatment: Gait belt Activity Tolerance: Patient tolerated treatment well Patient left: in bed;with call bell/phone within reach;with bed alarm set Nurse Communication:  Mobility status PT Visit Diagnosis: Other abnormalities of gait and mobility (R26.89);Muscle weakness (generalized) (M62.81);Pain Pain - Right/Left: Right Pain - part of body: Hip     Time: 0904-0937 PT Time Calculation (min) (ACUTE ONLY): 33 min  Charges:  $Therapeutic Exercise: 8-22 mins $Therapeutic Activity: 8-22 mins                     Lauren Y., PT, DPT  Acute Rehabilitation Services  Office [phone]   04/27/2021, 11:39 AM  

## 2021-04-27 NOTE — Progress Notes (Signed)
Patient has DME equipment at home

## 2021-04-27 NOTE — Progress Notes (Signed)
   Subjective: 1 Day Post-Op Procedure(s) (LRB): TOTAL HIP ARTHROPLASTY ANTERIOR APPROACH (Right) Patient reports pain as mild.   Patient seen in rounds for Dr. Alvan Dame. Patient is resting in bed with wife at the bedside this morning. No acute events overnight. They report he had considerable pain last night, but is feeling a bit better this morning. He has not worked with therapy yet. Has condom cath in place, voiding without difficulty.  We will start therapy today.   Objective: Vital signs in last 24 hours: Temp:  [97.3 F (36.3 C)-99.3 F (37.4 C)] 99.2 F (37.3 C) (11/04 0424) Pulse Rate:  [55-78] 72 (11/04 0424) Resp:  [13-19] 16 (11/04 0424) BP: (111-160)/(41-77) 123/59 (11/04 0424) SpO2:  [95 %-100 %] 97 % (11/04 0424) Weight:  [284 kg] 103 kg (11/03 1915)  Intake/Output from previous day:  Intake/Output Summary (Last 24 hours) at 04/27/2021 0946 Last data filed at 04/27/2021 0600 Gross per 24 hour  Intake 3323.33 ml  Output 2500 ml  Net 823.33 ml     Intake/Output this shift: No intake/output data recorded.  Labs: Recent Labs    04/27/21 0322  HGB 13.5   Recent Labs    04/27/21 0322  WBC 17.3*  RBC 4.05*  HCT 40.0  PLT 204   Recent Labs    04/27/21 0322  NA 138  K 4.3  CL 105  CO2 26  BUN 27*  CREATININE 1.13  GLUCOSE 171*  CALCIUM 9.0   No results for input(s): LABPT, INR in the last 72 hours.  Exam: General - Patient is Alert and Oriented Extremity - Neurologically intact Sensation intact distally Intact pulses distally Dorsiflexion/Plantar flexion intact Dressing - dressing C/D/I Motor Function - intact, moving foot and toes well on exam.   Past Medical History:  Diagnosis Date   Arthritis    Cancer ([place])    PROSTATE CANCER   Elevated PSA    Hypertension    PONV (postoperative nausea and vomiting)     Assessment/Plan: 1 Day Post-Op Procedure(s) (LRB): TOTAL HIP ARTHROPLASTY ANTERIOR APPROACH (Right) Active Problems:   S/P  total right hip arthroplasty  Estimated body mass index is 32.11 kg/m as calculated from the following:   Height as of this encounter: 5' 10.5" (1.791 m).   Weight as of this encounter: 103 kg. Advance diet Up with therapy D/C IV fluids  DVT Prophylaxis - Aspirin Weight bearing as tolerated.  Plan is to go Home after hospital stay. Plan to get up with PT today. Discharge home after 1-2 sessions as long as he is meeting his goals.  Follow up in the office in 2 weeks.    I recommended against using NSAIDs for now, as his creatinine remains elevated and he will be on aspirin prophylactically.  Griffith Citron, PA-C Orthopedic Surgery [phone] 04/27/2021, 9:46 AM

## 2021-04-27 NOTE — TOC Transition Note (Signed)
Transition of Care [facility]) - CM/SW Discharge Note  Patient Details  Name: Alan Snyder MRN: [id number] Date of Birth: 03/15/1946  Transition of [facility] CM/SW Contact:  Sherie Don, LCSW Phone Number: 04/27/2021, 10:00 AM  Clinical Narrative: Patient is expected to discharge home after working with PT. CSW met with patient to review discharge plan. Patient will discharge home with a home exercise program (HEP). Patient has a rolling walker, 3N1, and grabber at home so there are DME needs at this time. TOC signing off.  Final next level of care: Home/Self Care Barriers to Discharge: No Barriers Identified  Patient Goals and CMS Choice Patient states their goals for this hospitalization and ongoing recovery are:: Discharge home with HEP CMS Medicare.gov Compare Post Acute Care list provided to:: Patient Choice offered to / list presented to : NA  Discharge Plan and Services         DME Arranged: N/A DME Agency: NA HH Arranged: NA [place] Agency: NA  Readmission Risk Interventions No flowsheet data found.

## 2021-05-01 ENCOUNTER — Encounter (HOSPITAL_COMMUNITY): Payer: Self-pay | Admitting: Orthopedic Surgery

## 2021-05-03 NOTE — Discharge Summary (Signed)
Physician Discharge Summary   Patient ID: Alan Snyder MRN: [id number] DOB/AGE: [birth year] 75 y.o.  Admit date: 04/26/2021 Discharge date: 04/27/2021  Primary Diagnosis: Right  hip osteoarthritis.   Admission Diagnoses:  Past Medical History:  Diagnosis Date   Arthritis    Cancer ([place])    PROSTATE CANCER   Elevated PSA    Hypertension    PONV (postoperative nausea and vomiting)    Discharge Diagnoses:   Active Problems:   S/P total right hip arthroplasty  Estimated body mass index is 32.11 kg/m as calculated from the following:   Height as of this encounter: 5' 10.5" (1.791 m).   Weight as of this encounter: 103 kg.  Procedure:  Procedure(s) (LRB): TOTAL HIP ARTHROPLASTY ANTERIOR APPROACH (Right)   Consults: None  HPI:  Alan Snyder is a 75 y.o. male who had   presented to office for evaluation of right hip pain.  Radiographs revealed   progressive degenerative changes with bone-on-bone   articulation of the  hip joint, including subchondral cystic changes and osteophytes.  The patient had painful limited range of   motion significantly affecting their overall quality of life and function.  The patient was failing to    respond to conservative measures including medications and/or injections and activity modification and at this point was ready   to proceed with more definitive measures.  Consent was obtained for   benefit of pain relief.  Specific risks of infection, DVT, component   failure, dislocation, neurovascular injury, and need for revision surgery were reviewed in the office as well discussion of   the anterior versus posterior approach were reviewed.  Laboratory Data: Admission on 04/26/2021, Discharged on 04/27/2021  Component Date Value Ref Range Status   ABO/RH(D) 04/26/2021    Final                   Value:A POS Performed at [facility], [place] [address] [place], [place] [address]    WBC 04/27/2021 17.3 (A)  4.0 - 10.5 K/uL Final    RBC 04/27/2021 4.05 (A)  4.22 - 5.81 MIL/uL Final   Hemoglobin 04/27/2021 13.5  13.0 - 17.0 g/dL Final   HCT 04/27/2021 40.0  39.0 - 52.0 % Final   MCV 04/27/2021 98.8  80.0 - 100.0 fL Final   MCH 04/27/2021 33.3  26.0 - 34.0 pg Final   MCHC 04/27/2021 33.8  30.0 - 36.0 g/dL Final   RDW 04/27/2021 12.7  11.5 - 15.5 % Final   Platelets 04/27/2021 204  150 - 400 K/uL Final   nRBC 04/27/2021 0.0  0.0 - 0.2 % Final   Performed at [facility], [place] [address], [place], [place] [address]   Sodium 04/27/2021 138  135 - 145 mmol/L Final   Potassium 04/27/2021 4.3  3.5 - 5.1 mmol/L Final   Chloride 04/27/2021 105  98 - 111 mmol/L Final   CO2 04/27/2021 26  22 - 32 mmol/L Final   Glucose, Bld 04/27/2021 171 (A)  70 - 99 mg/dL Final   Glucose reference range applies only to samples taken after fasting for at least 8 hours.   BUN 04/27/2021 27 (A)  8 - 23 mg/dL Final   Creatinine, Ser 04/27/2021 1.13  0.61 - 1.24 mg/dL Final   Calcium 04/27/2021 9.0  8.9 - 10.3 mg/dL Final   GFR, Estimated 04/27/2021 >60  >60 mL/min Final   Comment: (NOTE) Calculated using the CKD-EPI Creatinine Equation (2021)  Anion gap 04/27/2021 7  5 - 15 Final   Performed at [facility], [place] [address] [place], [place] [address]  Orders Only on 04/23/2021  Component Date Value Ref Range Status   SARS Coronavirus 2 04/23/2021 RESULT: NEGATIVE   Final   Comment: RESULT: NEGATIVESARS-CoV-2 INTERPRETATION:A NEGATIVE  test result means that SARS-CoV-2 RNA was not present in the specimen above the limit of detection of this test. This does not preclude a possible SARS-CoV-2 infection and should not be used as the  sole basis for patient management decisions. Negative results must be combined with clinical observations, patient history, and epidemiological information. Optimum specimen types and timing for peak viral levels during infections caused by SARS-CoV-2  have not been determined.  Collection of multiple specimens or types of specimens may be necessary to detect virus. Improper specimen collection and handling, sequence variability under primers/probes, or organism present below the limit of detection may  lead to false negative results. Positive and negative predictive values of testing are highly dependent on prevalence. False negative test results are more likely when prevalence of disease is high.The expected result is NEGATIVE.Fact S                          heet for  Healthcare Providers: LocalChronicle.no Sheet for Patients: SalonLookup.es Reference Range - Negative   Hospital Outpatient Visit on 04/17/2021  Component Date Value Ref Range Status   MRSA, PCR 04/17/2021 NEGATIVE  NEGATIVE Final   Staphylococcus aureus 04/17/2021 POSITIVE (A)  NEGATIVE Final   Comment: (NOTE) The Xpert SA Assay (FDA approved for NASAL specimens in patients 22 years of age and older), is one component of a comprehensive surveillance program. It is not intended to diagnose infection nor to guide or monitor treatment. Performed at [facility], [place] [address], [place], [place] [address]    WBC 04/17/2021 7.3  4.0 - 10.5 K/uL Final   RBC 04/17/2021 4.39  4.22 - 5.81 MIL/uL Final   Hemoglobin 04/17/2021 14.9  13.0 - 17.0 g/dL Final   HCT 04/17/2021 43.7  39.0 - 52.0 % Final   MCV 04/17/2021 99.5  80.0 - 100.0 fL Final   MCH 04/17/2021 33.9  26.0 - 34.0 pg Final   MCHC 04/17/2021 34.1  30.0 - 36.0 g/dL Final   RDW 04/17/2021 12.9  11.5 - 15.5 % Final   Platelets 04/17/2021 212  150 - 400 K/uL Final   nRBC 04/17/2021 0.0  0.0 - 0.2 % Final   Performed at [facility], [place] [address] [place], [place] [address]   Sodium 04/17/2021 136  135 - 145 mmol/L Final   Potassium 04/17/2021 5.0  3.5 - 5.1 mmol/L Final   Chloride 04/17/2021 105  98 - 111 mmol/L Final   CO2 04/17/2021 26  22 - 32  mmol/L Final   Glucose, Bld 04/17/2021 107 (A)  70 - 99 mg/dL Final   Glucose reference range applies only to samples taken after fasting for at least 8 hours.   BUN 04/17/2021 27 (A)  8 - 23 mg/dL Final   Creatinine, Ser 04/17/2021 1.25 (A)  0.61 - 1.24 mg/dL Final   Calcium 04/17/2021 9.2  8.9 - 10.3 mg/dL Final   Total Protein 04/17/2021 7.2  6.5 - 8.1 g/dL Final   Albumin 04/17/2021 4.3  3.5 - 5.0 g/dL Final   AST 04/17/2021 24  15 - 41 U/L Final   ALT 04/17/2021  28  0 - 44 U/L Final   Alkaline Phosphatase 04/17/2021 38  38 - 126 U/L Final   Total Bilirubin 04/17/2021 0.8  0.3 - 1.2 mg/dL Final   GFR, Estimated 04/17/2021 >60  >60 mL/min Final   Comment: (NOTE) Calculated using the CKD-EPI Creatinine Equation (2021)    Anion gap 04/17/2021 5  5 - 15 Final   Performed at [facility], [place] [address] [place], [place] [address]   ABO/RH(D) 04/17/2021 A POS   Final   Antibody Screen 04/17/2021 NEG   Final   Sample Expiration 04/17/2021 04/29/2021,2359   Final   Extend sample reason 04/17/2021    Final                   Value:NO TRANSFUSIONS OR PREGNANCY IN THE PAST 3 MONTHS Performed at [facility], [place] [address], [place], [place] [address]      X-Rays:DG Pelvis Portable  Result Date: 04/26/2021 CLINICAL DATA:  Status post right total hip replacement EXAM: PORTABLE PELVIS 1-2 VIEWS COMPARISON:  Hip radiograph 04/26/2021 FINDINGS: There is a right total hip arthroplasty in normal alignment without evidence of loosening or periprosthetic fracture. Expected soft tissue changes. Normal aligned left total hip arthroplasty also without evidence of complication. Mild changes of osteitis pubis. Mild lower lumbar spine and bilateral SI joint degenerative IMPRESSION: Right total hip arthroplasty in normal alignment without evidence of immediate hardware complication on single frontal view. Electronically Signed   By: Maurine Simmering M.D.   On: 04/26/2021 12:31    DG C-Arm 1-60 Min-No Report  Result Date: 04/26/2021 CLINICAL DATA:  Right hip replacement EXAM: OPERATIVE RIGHT HIP (WITH PELVIS IF PERFORMED)  VIEWS TECHNIQUE: Fluoroscopic spot image(s) were submitted for interpretation post-operatively. COMPARISON:  None. FLUOROSCOPY TIME:  00:15 FINDINGS: Intraoperative fluoroscopic images of the right hip demonstrate right hip total arthroplasty. No evidence of perihardware fracture or component malpositioning. IMPRESSION: Intraoperative fluoroscopic images of the right hip demonstrate right hip total arthroplasty. No evidence of perihardware fracture or component malpositioning. Electronically Signed   By: Delanna Ahmadi M.D.   On: 04/26/2021 11:41   DG HIP OPERATIVE UNILAT W OR W/O PELVIS RIGHT  Result Date: 04/26/2021 CLINICAL DATA:  Right hip replacement EXAM: OPERATIVE RIGHT HIP (WITH PELVIS IF PERFORMED)  VIEWS TECHNIQUE: Fluoroscopic spot image(s) were submitted for interpretation post-operatively. COMPARISON:  None. FLUOROSCOPY TIME:  00:15 FINDINGS: Intraoperative fluoroscopic images of the right hip demonstrate right hip total arthroplasty. No evidence of perihardware fracture or component malpositioning. IMPRESSION: Intraoperative fluoroscopic images of the right hip demonstrate right hip total arthroplasty. No evidence of perihardware fracture or component malpositioning. Electronically Signed   By: Delanna Ahmadi M.D.   On: 04/26/2021 11:41    EKG: Orders placed or performed during the hospital encounter of 04/17/21   EKG 12-Lead   EKG 12-Lead     Hospital Course: Arshan Jabs is a 75 y.o. who was admitted to [facility]. They were brought to the operating room on 04/26/2021 and underwent Procedure(s): [place].  Patient tolerated the procedure well and was later transferred to the recovery room and then to the orthopaedic floor for postoperative care. They were given PO and IV analgesics for pain control  following their surgery. They were given 24 hours of postoperative antibiotics of  Anti-infectives (From admission, onward)    Start     Dose/Rate Route Frequency Ordered Stop   04/26/21 1600  ceFAZolin (ANCEF) IVPB 2g/100  mL premix        2 g 200 mL/hr over 30 Minutes Intravenous Every 6 hours 04/26/21 1359 04/26/21 2201   04/26/21 0745  ceFAZolin (ANCEF) IVPB 2g/100 mL premix        2 g 200 mL/hr over 30 Minutes Intravenous On call to O.R. 04/26/21 0736 04/26/21 1018      and started on DVT prophylaxis in the form of Aspirin.   PT and OT were ordered for total joint protocol. Discharge planning consulted to help with postop disposition and equipment needs.  Patient had a good night on the evening of surgery. They started to get up OOB with therapy on POD #0. Pt was seen during rounds and was ready to go home pending progress with therapy.He worked with therapy on POD #1 and was meeting his goals. Pt was discharged to home later that day in stable condition.  Diet: Regular diet Activity: WBAT Follow-up: in 2 weeks Disposition: Home Discharged Condition: good   Discharge Instructions     Call MD / Call 911   Complete by: As directed    If you experience chest pain or shortness of breath, CALL 911 and be transported to the hospital emergency room.  If you develope a fever above 101 F, pus (white drainage) or increased drainage or redness at the wound, or calf pain, call your surgeon's office.   Change dressing   Complete by: As directed    Maintain surgical dressing until follow up in the clinic. If the edges start to pull up, may reinforce with tape. If the dressing is no longer working, may remove and cover with gauze and tape, but must keep the area dry and clean.  Call with any questions or concerns.   Constipation Prevention   Complete by: As directed    Drink plenty of fluids.  Prune juice may be helpful.  You may use a stool softener, such as Colace (over the counter) 100 mg  twice a day.  Use MiraLax (over the counter) for constipation as needed.   Diet - low sodium heart healthy   Complete by: As directed    Increase activity slowly as tolerated   Complete by: As directed    Weight bearing as tolerated with assist device (walker, cane, etc) as directed, use it as long as suggested by your surgeon or therapist, typically at least 4-6 weeks.   Post-operative opioid taper instructions:   Complete by: As directed    POST-OPERATIVE OPIOID TAPER INSTRUCTIONS: It is important to wean off of your opioid medication as soon as possible. If you do not need pain medication after your surgery it is ok to stop day one. Opioids include: Codeine, Hydrocodone(Norco, Vicodin), Oxycodone(Percocet, oxycontin) and hydromorphone amongst others.  Long term and even short term use of opiods can cause: Increased pain response Dependence Constipation Depression Respiratory depression And more.  Withdrawal symptoms can include Flu like symptoms Nausea, vomiting And more Techniques to manage these symptoms Hydrate well Eat regular healthy meals Stay active Use relaxation techniques(deep breathing, meditating, yoga) Do Not substitute Alcohol to help with tapering If you have been on opioids for less than two weeks and do not have pain than it is ok to stop all together.  Plan to wean off of opioids This plan should start within one week post op of your joint replacement. Maintain the same interval or time between taking each dose and first decrease the dose.  Cut the total daily intake of opioids  by one tablet each day Next start to increase the time between doses. The last dose that should be eliminated is the evening dose.      TED hose   Complete by: As directed    Use stockings (TED hose) for 2 weeks on both leg(s).  You may remove them at night for sleeping.      Allergies as of 04/27/2021       Reactions   Morphine And Related Itching        Medication List      STOP taking these medications    acetaminophen 500 MG tablet Commonly known as: TYLENOL   traMADol 50 MG tablet Commonly known as: ULTRAM       TAKE these medications    aspirin 81 MG chewable tablet Chew 1 tablet (81 mg total) by mouth 2 (two) times daily for 28 days.   docusate sodium 100 MG capsule Commonly known as: COLACE Take 1 capsule (100 mg total) by mouth 2 (two) times daily.   ezetimibe 10 MG tablet Commonly known as: ZETIA Take 10 mg by mouth daily.   fenofibrate 145 MG tablet Commonly known as: TRICOR Take 145 mg by mouth daily.   gabapentin 300 MG capsule Commonly known as: NEURONTIN Take 600-900 mg by mouth See admin instructions. Take 600 mg by mouth in the morning and 900 mg at night   HYDROcodone-acetaminophen 5-325 MG tablet Commonly known as: NORCO/VICODIN Take 1-2 tablets by mouth every 6 (six) hours as needed for severe pain.   methocarbamol 500 MG tablet Commonly known as: ROBAXIN Take 1 tablet (500 mg total) by mouth every 6 (six) hours as needed for muscle spasms.   multivitamin with minerals Tabs tablet Take 1 tablet by mouth daily.   nebivolol 5 MG tablet Commonly known as: BYSTOLIC Take 5 mg by mouth daily.   polyethylene glycol 17 g packet Commonly known as: MIRALAX / GLYCOLAX Take 17 g by mouth daily as needed for mild constipation.   traZODone 50 MG tablet Commonly known as: DESYREL Take 50-150 mg by mouth at bedtime.   valsartan 320 MG tablet Commonly known as: DIOVAN Take 320 mg by mouth daily.               Discharge Care Instructions  (From admission, onward)           Start     Ordered   04/27/21 0000  Change dressing       Comments: Maintain surgical dressing until follow up in the clinic. If the edges start to pull up, may reinforce with tape. If the dressing is no longer working, may remove and cover with gauze and tape, but must keep the area dry and clean.  Call with any questions or concerns.    04/27/21 3875            Follow-up Information     Irving Copas, PA-C. Go on 05/09/2021.   Specialty: Orthopedic Surgery Why: You have a follow up appointment on 05-09-21 at 10:00 am. Contact information: [address] [place] [address] [phone]                 Signed: Griffith Citron, PA-C Orthopedic Surgery 05/03/2021, 2:26 PM

## 2021-05-23 DIAGNOSIS — S338XXA Sprain of other parts of lumbar spine and pelvis, initial encounter: Secondary | ICD-10-CM | POA: Diagnosis not present

## 2021-05-23 DIAGNOSIS — M9903 Segmental and somatic dysfunction of lumbar region: Secondary | ICD-10-CM | POA: Diagnosis not present

## 2021-05-25 DIAGNOSIS — S338XXA Sprain of other parts of lumbar spine and pelvis, initial encounter: Secondary | ICD-10-CM | POA: Diagnosis not present

## 2021-05-25 DIAGNOSIS — M9903 Segmental and somatic dysfunction of lumbar region: Secondary | ICD-10-CM | POA: Diagnosis not present

## 2021-05-30 DIAGNOSIS — S338XXA Sprain of other parts of lumbar spine and pelvis, initial encounter: Secondary | ICD-10-CM | POA: Diagnosis not present

## 2021-05-30 DIAGNOSIS — M9903 Segmental and somatic dysfunction of lumbar region: Secondary | ICD-10-CM | POA: Diagnosis not present

## 2021-06-06 DIAGNOSIS — M9903 Segmental and somatic dysfunction of lumbar region: Secondary | ICD-10-CM | POA: Diagnosis not present

## 2021-06-06 DIAGNOSIS — S338XXA Sprain of other parts of lumbar spine and pelvis, initial encounter: Secondary | ICD-10-CM | POA: Diagnosis not present

## 2021-06-13 DIAGNOSIS — M9903 Segmental and somatic dysfunction of lumbar region: Secondary | ICD-10-CM | POA: Diagnosis not present

## 2021-06-13 DIAGNOSIS — S338XXA Sprain of other parts of lumbar spine and pelvis, initial encounter: Secondary | ICD-10-CM | POA: Diagnosis not present

## 2021-06-20 DIAGNOSIS — Z471 Aftercare following joint replacement surgery: Secondary | ICD-10-CM | POA: Diagnosis not present

## 2021-06-20 DIAGNOSIS — Z96641 Presence of right artificial hip joint: Secondary | ICD-10-CM | POA: Diagnosis not present

## 2021-06-20 DIAGNOSIS — Z4789 Encounter for other orthopedic aftercare: Secondary | ICD-10-CM | POA: Diagnosis not present

## 2021-08-20 ENCOUNTER — Encounter (INDEPENDENT_AMBULATORY_CARE_PROVIDER_SITE_OTHER): Payer: Self-pay | Admitting: Ophthalmology

## 2021-08-20 ENCOUNTER — Encounter (INDEPENDENT_AMBULATORY_CARE_PROVIDER_SITE_OTHER): Payer: Medicare Other | Admitting: Ophthalmology

## 2021-08-20 ENCOUNTER — Other Ambulatory Visit: Payer: Self-pay

## 2021-08-20 ENCOUNTER — Ambulatory Visit (INDEPENDENT_AMBULATORY_CARE_PROVIDER_SITE_OTHER): Payer: Medicare Other | Admitting: Ophthalmology

## 2021-08-20 DIAGNOSIS — H43812 Vitreous degeneration, left eye: Secondary | ICD-10-CM

## 2021-08-20 DIAGNOSIS — H2513 Age-related nuclear cataract, bilateral: Secondary | ICD-10-CM | POA: Diagnosis not present

## 2021-08-20 DIAGNOSIS — H353131 Nonexudative age-related macular degeneration, bilateral, early dry stage: Secondary | ICD-10-CM | POA: Diagnosis not present

## 2021-08-20 DIAGNOSIS — H43811 Vitreous degeneration, right eye: Secondary | ICD-10-CM | POA: Diagnosis not present

## 2021-08-20 NOTE — Progress Notes (Signed)
08/20/2021     CHIEF COMPLAINT Patient presents for  Chief Complaint  Patient presents with   Macular Degeneration      HISTORY OF PRESENT ILLNESS: Alan Snyder is a 76 y.o. male who presents to the clinic today for:   HPI   1 yr fu ou oct. Patient states vision is stable and unchanged since last visit. Denies any new floaters or FOL.  Last edited by Laurin Coder on 08/20/2021  1:58 PM.      Referring physician: Maryella Shivers, MD [place],  [place] [address]  HISTORICAL INFORMATION:   Selected notes from the MEDICAL RECORD NUMBER    Lab Results  Component Value Date   HGBA1C 5.4 04/06/2019     CURRENT MEDICATIONS: No current outpatient medications on file. (Ophthalmic Drugs)   No current facility-administered medications for this visit. (Ophthalmic Drugs)   Current Outpatient Medications (Other)  Medication Sig   docusate sodium (COLACE) 100 MG capsule Take 1 capsule (100 mg total) by mouth 2 (two) times daily.   ezetimibe (ZETIA) 10 MG tablet Take 10 mg by mouth daily.   fenofibrate (TRICOR) 145 MG tablet Take 145 mg by mouth daily.   gabapentin (NEURONTIN) 300 MG capsule Take 600-900 mg by mouth See admin instructions. Take 600 mg by mouth in the morning and 900 mg at night   HYDROcodone-acetaminophen (NORCO/VICODIN) 5-325 MG tablet Take 1-2 tablets by mouth every 6 (six) hours as needed for severe pain.   methocarbamol (ROBAXIN) 500 MG tablet Take 1 tablet (500 mg total) by mouth every 6 (six) hours as needed for muscle spasms.   Multiple Vitamin (MULTIVITAMIN WITH MINERALS) TABS tablet Take 1 tablet by mouth daily.   nebivolol (BYSTOLIC) 5 MG tablet Take 5 mg by mouth daily.   polyethylene glycol (MIRALAX / GLYCOLAX) 17 g packet Take 17 g by mouth daily as needed for mild constipation.   traZODone (DESYREL) 50 MG tablet Take 50-150 mg by mouth at bedtime.   valsartan (DIOVAN) 320 MG tablet Take 320 mg by mouth daily.   No  current facility-administered medications for this visit. (Other)      REVIEW OF SYSTEMS:    ALLERGIES Allergies  Allergen Reactions   Morphine And Related Itching    PAST MEDICAL HISTORY Past Medical History:  Diagnosis Date   Arthritis    Cancer ([place])    PROSTATE CANCER   Elevated PSA    Hypertension    PONV (postoperative nausea and vomiting)    Past Surgical History:  Procedure Laterality Date   PROSTATECTOMY     TOTAL HIP ARTHROPLASTY Left    TOTAL HIP ARTHROPLASTY Right 04/26/2021   Procedure: TOTAL HIP ARTHROPLASTY ANTERIOR APPROACH;  Surgeon: Paralee Cancel, MD;  Location: [facility];  Service: Orthopedics;  Laterality: Right;    FAMILY HISTORY History reviewed. No pertinent family history.  SOCIAL HISTORY Social History   Tobacco Use   Smoking status: Former    Types: Cigarettes   Smokeless tobacco: Never  Vaping Use   Vaping Use: Never used  Substance Use Topics   Alcohol use: Yes    Comment: OCCASIONAL   Drug use: Never         OPHTHALMIC EXAM:  Base Eye Exam     Visual Acuity (ETDRS)       Right Left   Dist [place] 20/40 -2 20/25 +2   Dist ph  20/25 -1  Tonometry (Tonopen, 2:01 PM)       Right Left   Pressure 20 16         Pupils       Pupils Dark Light APD   Right PERRL 4 3 None   Left PERRL 4 3 None         Visual Fields (Counting fingers)       Left Right    Full Full         Extraocular Movement       Right Left    Full Full         Neuro/Psych     Oriented x3: Yes   Mood/Affect: Normal         Dilation     Both eyes: 1.0% Mydriacyl, 2.5% Phenylephrine @ 2:01 PM           Slit Lamp and Fundus Exam     External Exam       Right Left   External Normal Normal         Slit Lamp Exam       Right Left   Lids/Lashes Normal Normal   Conjunctiva/Sclera White and quiet White and quiet   Cornea Clear Clear   Anterior Chamber Deep and quiet Deep and quiet   Iris Round and reactive  Round and reactive   Lens Nuclear sclerosis 1+ Nuclear sclerosis 1+   Anterior Vitreous Normal Normal         Fundus Exam       Right Left   Posterior Vitreous Posterior vitreous detachment Posterior vitreous detachment   Disc Normal Normal   C/D Ratio 0.5 0.55   Macula Normal Normal   Vessels no DR no DR   Periphery good retinopexy at 9 and 12 o'clock Normal            IMAGING AND PROCEDURES  Imaging and Procedures for 08/20/21  OCT, Retina - OU - Both Eyes       Right Eye Quality was good. Scan locations included subfoveal. Central Foveal Thickness: 304. Findings include normal foveal contour.   Left Eye Quality was good. Scan locations included subfoveal. Central Foveal Thickness: 298. Findings include normal foveal contour.              ASSESSMENT/PLAN:  Early stage nonexudative age-related macular degeneration of both eyes The nature of age--related macular degeneration was discussed with the patient as well as the distinction between dry and wet types. Checking an Amsler Grid daily with advice to return immediately should a distortion develop, was given to the patient. The patient 's smoking status now and in the past was determined and advice based on the AREDS study was provided regarding the consumption of antioxidant supplements. AREDS 2 vitamin formulation was recommended. Consumption of dark leafy vegetables and fresh fruits of various colors was recommended. Treatment modalities for wet macular degeneration particularly the use of intravitreal injections of anti-blood vessel growth factors was discussed with the patient. Avastin, Lucentis, and Eylea are the available options. On occasion, therapy includes the use of photodynamic therapy and thermal laser. Stressed to the patient do not rub eyes.  Patient was advised to check Amsler Grid daily and return immediately if changes are noted. Instructions on using the grid were given to the patient. All patient  questions were answered.  Nuclear sclerotic cataract of both eyes The nature of cataract was discussed with the patient as well as the elective nature of surgery. The patient was  reassured that surgery at a later date does not put the patient at risk for a worse outcome. It was emphasized that the need for surgery is dictated by the patient's quality of life as influenced by the cataract. Patient was instructed to maintain close follow up with their general eye care doctor.     ICD-10-CM   1. Posterior vitreous detachment of right eye  H43.811 OCT, Retina - OU - Both Eyes    2. Posterior vitreous detachment of left eye  H43.812 OCT, Retina - OU - Both Eyes    3. Early stage nonexudative age-related macular degeneration of both eyes  H35.3131     4. Nuclear sclerotic cataract of both eyes  H25.13       1.  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  2.  ARMD OU  3.  PVD OU  Ophthalmic Meds Ordered this visit:  No orders of the defined types were placed in this encounter.      Return in about 1 year (around 08/20/2022) for DILATE OU, OCT, COLOR FP.  There are no Patient Instructions on file for this visit.   Explained the diagnoses, plan, and follow up with the patient and they expressed understanding.  Patient expressed understanding of the importance of proper follow up care.   Clent Demark Katori Wirsing M.D. Diseases & Surgery of the Retina and Vitreous [facility] [place] 08/20/21     Abbreviations: M myopia (nearsighted); A astigmatism; H hyperopia (farsighted); P presbyopia; Mrx spectacle prescription;  CTL contact lenses; OD right eye; OS left eye; OU both eyes  XT exotropia; ET esotropia; PEK punctate epithelial keratitis; PEE punctate epithelial erosions; DES dry eye syndrome; MGD meibomian gland dysfunction; ATs artificial tears; PFAT's preservative free artificial tears; [place] nuclear sclerotic cataract; PSC posterior subcapsular cataract; ERM epi-retinal membrane; PVD posterior vitreous  detachment; RD retinal detachment; DM diabetes mellitus; DR diabetic retinopathy; NPDR non-proliferative diabetic retinopathy; PDR proliferative diabetic retinopathy; CSME clinically significant macular edema; DME diabetic macular edema; dbh dot blot hemorrhages; CWS cotton wool spot; POAG primary open angle glaucoma; C/D cup-to-disc ratio; HVF humphrey visual field; GVF goldmann visual field; OCT optical coherence tomography; IOP intraocular pressure; BRVO Branch retinal vein occlusion; CRVO central retinal vein occlusion; CRAO central retinal artery occlusion; BRAO branch retinal artery occlusion; RT retinal tear; SB scleral buckle; PPV pars plana vitrectomy; VH Vitreous hemorrhage; PRP panretinal laser photocoagulation; IVK intravitreal kenalog; VMT vitreomacular traction; MH Macular hole;  NVD neovascularization of the disc; NVE neovascularization elsewhere; AREDS age related eye disease study; ARMD age related macular degeneration; POAG primary open angle glaucoma; EBMD epithelial/anterior basement membrane dystrophy; ACIOL anterior chamber intraocular lens; IOL intraocular lens; PCIOL posterior chamber intraocular lens; Phaco/IOL phacoemulsification with intraocular lens placement; [place] photorefractive keratectomy; LASIK laser assisted in situ keratomileusis; HTN hypertension; DM diabetes mellitus; COPD chronic obstructive pulmonary disease

## 2021-08-20 NOTE — Assessment & Plan Note (Signed)

## 2021-08-20 NOTE — Assessment & Plan Note (Signed)

## 2021-09-03 DIAGNOSIS — Z79899 Other long term (current) drug therapy: Secondary | ICD-10-CM | POA: Diagnosis not present

## 2021-09-03 DIAGNOSIS — G629 Polyneuropathy, unspecified: Secondary | ICD-10-CM | POA: Diagnosis not present

## 2021-09-12 DIAGNOSIS — N529 Male erectile dysfunction, unspecified: Secondary | ICD-10-CM | POA: Diagnosis not present

## 2021-09-12 DIAGNOSIS — N390 Urinary tract infection, site not specified: Secondary | ICD-10-CM | POA: Diagnosis not present

## 2021-09-12 DIAGNOSIS — E291 Testicular hypofunction: Secondary | ICD-10-CM | POA: Diagnosis not present

## 2021-09-12 DIAGNOSIS — C61 Malignant neoplasm of prostate: Secondary | ICD-10-CM | POA: Diagnosis not present

## 2021-10-19 DIAGNOSIS — I10 Essential (primary) hypertension: Secondary | ICD-10-CM | POA: Diagnosis not present

## 2021-10-19 DIAGNOSIS — E782 Mixed hyperlipidemia: Secondary | ICD-10-CM | POA: Diagnosis not present

## 2021-10-19 DIAGNOSIS — E1140 Type 2 diabetes mellitus with diabetic neuropathy, unspecified: Secondary | ICD-10-CM | POA: Diagnosis not present

## 2021-10-22 DIAGNOSIS — Z1339 Encounter for screening examination for other mental health and behavioral disorders: Secondary | ICD-10-CM | POA: Diagnosis not present

## 2021-10-22 DIAGNOSIS — Z136 Encounter for screening for cardiovascular disorders: Secondary | ICD-10-CM | POA: Diagnosis not present

## 2021-10-22 DIAGNOSIS — Z1389 Encounter for screening for other disorder: Secondary | ICD-10-CM | POA: Diagnosis not present

## 2021-10-22 DIAGNOSIS — E242 Drug-induced Cushing's syndrome: Secondary | ICD-10-CM | POA: Diagnosis not present

## 2021-10-22 DIAGNOSIS — Z789 Other specified health status: Secondary | ICD-10-CM | POA: Diagnosis not present

## 2021-10-22 DIAGNOSIS — Z139 Encounter for screening, unspecified: Secondary | ICD-10-CM | POA: Diagnosis not present

## 2021-10-22 DIAGNOSIS — E1140 Type 2 diabetes mellitus with diabetic neuropathy, unspecified: Secondary | ICD-10-CM | POA: Diagnosis not present

## 2021-10-22 DIAGNOSIS — E782 Mixed hyperlipidemia: Secondary | ICD-10-CM | POA: Diagnosis not present

## 2021-10-22 DIAGNOSIS — Z1331 Encounter for screening for depression: Secondary | ICD-10-CM | POA: Diagnosis not present

## 2021-10-22 DIAGNOSIS — Z0000 Encounter for general adult medical examination without abnormal findings: Secondary | ICD-10-CM | POA: Diagnosis not present

## 2021-10-22 DIAGNOSIS — I10 Essential (primary) hypertension: Secondary | ICD-10-CM | POA: Diagnosis not present

## 2021-11-05 DIAGNOSIS — R635 Abnormal weight gain: Secondary | ICD-10-CM | POA: Diagnosis not present

## 2021-11-05 DIAGNOSIS — Z6832 Body mass index (BMI) 32.0-32.9, adult: Secondary | ICD-10-CM | POA: Diagnosis not present

## 2021-11-05 DIAGNOSIS — R600 Localized edema: Secondary | ICD-10-CM | POA: Diagnosis not present

## 2021-11-06 DIAGNOSIS — R600 Localized edema: Secondary | ICD-10-CM | POA: Diagnosis not present

## 2021-11-08 DIAGNOSIS — I361 Nonrheumatic tricuspid (valve) insufficiency: Secondary | ICD-10-CM | POA: Diagnosis not present

## 2021-11-08 DIAGNOSIS — R600 Localized edema: Secondary | ICD-10-CM | POA: Diagnosis not present

## 2021-11-08 DIAGNOSIS — I081 Rheumatic disorders of both mitral and tricuspid valves: Secondary | ICD-10-CM | POA: Diagnosis not present

## 2021-11-12 DIAGNOSIS — I5189 Other ill-defined heart diseases: Secondary | ICD-10-CM | POA: Diagnosis not present

## 2021-11-12 DIAGNOSIS — Z6832 Body mass index (BMI) 32.0-32.9, adult: Secondary | ICD-10-CM | POA: Diagnosis not present

## 2021-11-12 DIAGNOSIS — G8929 Other chronic pain: Secondary | ICD-10-CM | POA: Diagnosis not present

## 2021-11-12 DIAGNOSIS — R600 Localized edema: Secondary | ICD-10-CM | POA: Diagnosis not present

## 2021-11-20 NOTE — Patient Outreach (Signed)
Received a Health Coach referral notification for Mr. Alan Snyder. I have assigned Johny Shock, RN to call for follow up and determine if there are any Case Management needs.    [facility], [place], [place] Management [phone]

## 2021-11-22 ENCOUNTER — Other Ambulatory Visit: Payer: Self-pay | Admitting: *Deleted

## 2021-11-22 NOTE — Patient Outreach (Signed)
[facility]) Care Management  11/22/2021  Alan Snyder 12-Feb-1946 [id number]   RN Health Coach unsuccessful screening outreach call to patient.  [employer] compliance voicemail message left with return call back number.  Plan: RN will follow up outreach within 10 days.  [place] Care Management [phone]

## 2021-11-23 DIAGNOSIS — I10 Essential (primary) hypertension: Secondary | ICD-10-CM | POA: Insufficient documentation

## 2021-11-26 DIAGNOSIS — R609 Edema, unspecified: Secondary | ICD-10-CM | POA: Diagnosis not present

## 2021-11-26 DIAGNOSIS — Z139 Encounter for screening, unspecified: Secondary | ICD-10-CM | POA: Diagnosis not present

## 2021-11-26 DIAGNOSIS — R600 Localized edema: Secondary | ICD-10-CM | POA: Diagnosis not present

## 2021-11-26 DIAGNOSIS — M7989 Other specified soft tissue disorders: Secondary | ICD-10-CM | POA: Diagnosis not present

## 2021-11-26 DIAGNOSIS — Z6832 Body mass index (BMI) 32.0-32.9, adult: Secondary | ICD-10-CM | POA: Diagnosis not present

## 2021-11-26 DIAGNOSIS — M79661 Pain in right lower leg: Secondary | ICD-10-CM | POA: Diagnosis not present

## 2021-11-26 DIAGNOSIS — G8929 Other chronic pain: Secondary | ICD-10-CM | POA: Diagnosis not present

## 2021-11-26 NOTE — Patient Outreach (Signed)
[facility]) Care Management [place] Note   11/26/2021 Name:  Alan Snyder MRN:  [id number] DOB:  [birth year]  Summary: He is having swelling in lower extremities and hands. He stated that he does have some shortness of breath on moderate exertion. Monitors blood pressure and weight daily. He swims 3x a week for 1 hour. Currently having some back pain and neck pain. Per patient he is taking his medications as per ordered. His A1C has increased from 5.5 to 6.1. Per patient he thought it was due to the prednisone he was on for 3 months. He does not check blood sugars at home. RN discussed continuing to monitor blood pressure,weight and swelling. RN sent educational material to patient on HTN and CHF.   Recommendations/Changes made from today's visit: Medication adherence Continue to monitor blood pressure and weights Continue exercise routine   Subjective: Alan Snyder is an 76 y.o. year old male who is a primary patient of Lawson Radar, [place]. The care management team was consulted for assistance with care management and/or care coordination needs.    RN Health Coach completed Telephone Visit today.   Objective:  Medications Reviewed Today     Reviewed by Hurman Horn, MD (Physician) on 08/20/21 at 1443  Med List Status: <None>   Medication Order Taking? Sig Documenting Provider Last Dose Status Informant  docusate sodium (COLACE) 100 MG capsule [id number]  Take 1 capsule (100 mg total) by mouth 2 (two) times daily. Irving Copas, PA-C  Active   ezetimibe (ZETIA) 10 MG tablet [id number] No Take 10 mg by mouth daily. [provider] Past Week Active Self  fenofibrate (TRICOR) 145 MG tablet [id number] No Take 145 mg by mouth daily. [provider] Past Week Active Self  gabapentin (NEURONTIN) 300 MG capsule [id number] No Take 600-900 mg by mouth See admin instructions. Take 600 mg by mouth in the morning and 900 mg at night [provider] 04/25/2021 Active Self  HYDROcodone-acetaminophen (NORCO/VICODIN) 5-325 MG tablet [id number]  Take 1-2 tablets by mouth every 6 (six) hours as needed for severe pain. Irving Copas, PA-C  Active   methocarbamol (ROBAXIN) 500 MG tablet [id number]  Take 1 tablet (500 mg total) by mouth every 6 (six) hours as needed for muscle spasms. Irving Copas, PA-C  Active   Multiple Vitamin (MULTIVITAMIN WITH MINERALS) TABS tablet [id number] No Take 1 tablet by mouth daily. [provider] Past Week Active Self  nebivolol (BYSTOLIC) 5 MG tablet [id number] No Take 5 mg by mouth daily. [provider] 04/25/2021 2300 Active Self  polyethylene glycol (MIRALAX / GLYCOLAX) 17 g packet [id number]  Take 17 g by mouth daily as needed for mild constipation. Irving Copas, PA-C  Active   traZODone (DESYREL) 50 MG tablet [id number] No Take 50-150 mg by mouth at bedtime. [provider] 04/25/2021 Active Self  valsartan (DIOVAN) 320 MG tablet [id number] No Take 320 mg by mouth daily. [provider] 04/25/2021 Active Self             SDOH:  (Social Determinants of Health) assessments and interventions performed:  SDOH Interventions    Flowsheet Row Most Recent Value  SDOH Interventions   Food Insecurity Interventions Intervention Not Indicated  Housing Interventions Intervention Not Indicated  Physical Activity Interventions Intervention Not Indicated  Transportation Interventions Intervention Not Indicated       Care Plan  Review of patient past medical history, allergies, medications, health status, including review of  consultants reports, laboratory and other test data, was performed as part of comprehensive evaluation for care management services.   Care Plan : RN Care Manager Plan of Care  Updates made by Rewa Weissberg, Eppie Gibson, RN since 11/26/2021 12:00 AM     Problem: Knowledge [place] Related to Hypertension and Care Coordination Needs   Priority: High      Long-Range Goal: Development Plan of Care for Management of Hypertension   Start Date: 11/23/2021  Expected End Date: 11/24/2022  Priority: High  Note:   Current Barriers:  Knowledge Deficits related to plan of care for management of HTN   RNCM Clinical Goal(s):  Patient will verbalize understanding of plan for management of HTN as evidenced by blood pressure controlled, medication adherence and low sodium diet  through collaboration with RN Care manager, provider, and care team.   Interventions: Inter-disciplinary care team collaboration (see longitudinal plan of care) Evaluation of current treatment plan related to  self management and patient's adherence to plan as established by provider   Hypertension Interventions:  (Status:  Goal on track:  Yes.) Long Term Goal Last practice recorded BP readings:  BP Readings from Last 3 Encounters:  04/27/21 (!) 123/59  04/17/21 (!) 155/79  07/08/19 137/82  Most recent eGFR/CrCl: No results found for: EGFR  No components found for: CRCL  Evaluation of current treatment plan related to hypertension self management and patient's adherence to plan as established by provider Provided education to patient re: stroke prevention, s/s of heart attack and stroke Reviewed medications with patient and discussed importance of compliance Advised patient to discuss swelling in lower extremities, hands  with provider Provided education on prescribed diet low sodium diet  Patient Goals/Self-Care Activities: Take all medications as prescribed Attend all scheduled provider appointments Call pharmacy for medication refills 3-7 days in advance of running out of medications Attend church or other social activities Perform all self care activities independently  Call provider office for new concerns or questions  call the Suicide and Crisis Lifeline: 988 if experiencing a Mental Health or [place]  check blood pressure daily learn about high  blood pressure take blood pressure log to all doctor appointments call doctor for signs and symptoms of high blood pressure keep all doctor appointments take medications for blood pressure exactly as prescribed report new symptoms to your doctor limit salt intake to 2300 mg/day  Follow Up Plan:  Telephone follow up appointment with care management team member scheduled for:  February 26, 2022 The patient has been provided with contact information for the care management team and has been advised to call with any health related questions or concerns.    [id number] Per patient he is having swelling in lower extremities and hands. He stated that he does have some shortness of breath on moderate exertion. He monitors blood pressure and weight daily. He swims 3x a week for 1 hour. He stated he is currently having some back pain and neck pain. Per patient he is taking his medications as per ordered. His A1C has increased from 5.5 to 6.1. Per patient he thought it was due to the prednisone he was on for 3 months. He does not check blood sugars at home. RN discussed continuing to monitor blood pressure,weight and swelling. RN sent educational material to patient on HTN and CHF.       Plan: Telephone follow up appointment with care management team member scheduled for:  February 26, 2022 The patient has been provided with contact  information for the care management team and has been advised to call with any health related questions or concerns.   [place] Care Management [phone]

## 2021-11-28 NOTE — Patient Instructions (Signed)
0Visit Information  Thank you for taking time to visit with me today. Please don't hesitate to contact me if I can be of assistance to you before our next scheduled telephone appointment.  Following are the goals we discussed today:  Current Barriers:  Knowledge Deficits related to plan of care for management of HTN   RNCM Clinical Goal(s):  Patient will verbalize understanding of plan for management of HTN as evidenced by blood pressure controlled, medication adherence and low sodium diet through collaboration with RN Care manager, provider, and care team.   Interventions: Inter-disciplinary care team collaboration (see longitudinal plan of care) Evaluation of current treatment plan related to  self management and patient's adherence to plan as established by provider   Hypertension Interventions:  (Status:  Goal on track:  Yes.) Long Term Goal Last practice recorded BP readings:  BP Readings from Last 3 Encounters:  04/27/21 (!) 123/59  04/17/21 (!) 155/79  07/08/19 137/82   Most recent eGFR/CrCl: No results found for: EGFR  No components found for: CRCL  Evaluation of current treatment plan related to hypertension self management and patient's adherence to plan as established by provider Provided education to patient re: stroke prevention, s/s of heart attack and stroke Reviewed medications with patient and discussed importance of compliance Advised patient to discuss swelling in lower extremities, hands  with provider Provided education on prescribed diet low sodium diet  Patient Goals/Self-Care Activities: Take all medications as prescribed Attend all scheduled provider appointments Call pharmacy for medication refills 3-7 days in advance of running out of medications Attend church or other social activities Perform all self care activities independently  Call provider office for new concerns or questions  call the Suicide and Crisis Lifeline: 988 if experiencing a Mental  Health or [place]  check blood pressure daily learn about high blood pressure take blood pressure log to all doctor appointments call doctor for signs and symptoms of high blood pressure keep all doctor appointments take medications for blood pressure exactly as prescribed report new symptoms to your doctor limit salt intake to 2300 mg/day  Follow Up Plan:  Telephone follow up appointment with care management team member scheduled for:  February 26, 2022 The patient has been provided with contact information for the care management team and has been advised to call with any health related questions or concerns.    [id number] Per patient he is having swelling in lower extremities and hands. He stated that he does have some shortness of breath on moderate exertion. He monitors blood pressure and weight daily. He swims 3x a week for 1 hour. He stated he is currently having some back pain and neck pain. Per patient he is taking his medications as per ordered. His A1C has increased from 5.5 to 6.1. Per patient he thought it was due to the prednisone he was on for 3 months. He does not check blood sugars at home. RN discussed continuing to monitor blood pressure,weight and swelling. RN sent educational material to patient on HTN and CHF.   Our next appointment is by telephone on February 26, 2022  Please call Johny Shock RN [phone] if you need to cancel or reschedule your appointment.   Please call the Suicide and Crisis Lifeline: 988 if you are experiencing a Mental Health or [place] or need someone to talk to.  Following is a copy of your care plan:  There are no care plans that you recently modified to display for this  patient.   The patient verbalized understanding of instructions, educational materials, and care plan provided today and agreed to receive a mailed copy of patient instructions, educational materials, and care plan.   Telephone follow  up appointment with care management team member scheduled for: The patient has been provided with contact information for the care management team and has been advised to call with any health related questions or concerns.   [place] Care Management [phone]

## 2021-11-28 NOTE — Patient Outreach (Signed)
[facility]) Care Management [place] Note   11/28/2021 Name:  Alan Snyder MRN:  [id number] DOB:  [birth year]  Summary: Patient he is having swelling in lower extremities and hands. He stated that he does have some shortness of breath on moderate exertion. He monitors blood pressure and weight daily. He swims 3x a week for 1 hour. Currently having some back pain and neck pain. Per patient he is taking his medications as per ordered. A1C has increased from 5.5 to 6.1. Per patient he thought it was due to the prednisone he was on for 3 months. Does not check blood sugars at home. RN discussed continuing to monitor blood pressure,weight and swelling. RN sent educational material to patient on HTN and CHF.   Recommendations/Changes made from today's visit: Monitor Blood pressure, weight and swelling at home Document and take all information to Dr visits Continue exercise routine    Subjective: Naszir Snyder is an 76 y.o. year old male who is a primary patient of Lawson Radar, [place]. The care management team was consulted for assistance with care management and/or care coordination needs.    RN Health Coach completed Telephone Visit today.   Objective:  Medications Reviewed Today     Reviewed by Hurman Horn, MD (Physician) on 08/20/21 at 1443  Med List Status: <None>   Medication Order Taking? Sig Documenting Provider Last Dose Status Informant  docusate sodium (COLACE) 100 MG capsule [id number]  Take 1 capsule (100 mg total) by mouth 2 (two) times daily. Irving Copas, PA-C  Active   ezetimibe (ZETIA) 10 MG tablet [id number] No Take 10 mg by mouth daily. [provider] Past Week Active Self  fenofibrate (TRICOR) 145 MG tablet [id number] No Take 145 mg by mouth daily. [provider] Past Week Active Self  gabapentin (NEURONTIN) 300 MG capsule [id number] No Take 600-900 mg by mouth See admin instructions. Take 600 mg by mouth in the morning and  900 mg at night [provider] 04/25/2021 Active Self  HYDROcodone-acetaminophen (NORCO/VICODIN) 5-325 MG tablet [id number]  Take 1-2 tablets by mouth every 6 (six) hours as needed for severe pain. Irving Copas, PA-C  Active   methocarbamol (ROBAXIN) 500 MG tablet [id number]  Take 1 tablet (500 mg total) by mouth every 6 (six) hours as needed for muscle spasms. Irving Copas, PA-C  Active   Multiple Vitamin (MULTIVITAMIN WITH MINERALS) TABS tablet [id number] No Take 1 tablet by mouth daily. [provider] Past Week Active Self  nebivolol (BYSTOLIC) 5 MG tablet [id number] No Take 5 mg by mouth daily. [provider] 04/25/2021 2300 Active Self  polyethylene glycol (MIRALAX / GLYCOLAX) 17 g packet [id number]  Take 17 g by mouth daily as needed for mild constipation. Irving Copas, PA-C  Active   traZODone (DESYREL) 50 MG tablet [id number] No Take 50-150 mg by mouth at bedtime. [provider] 04/25/2021 Active Self  valsartan (DIOVAN) 320 MG tablet [id number] No Take 320 mg by mouth daily. [provider] 04/25/2021 Active Self             SDOH:  (Social Determinants of Health) assessments and interventions performed:  SDOH Interventions    Flowsheet Row Most Recent Value  SDOH Interventions   Food Insecurity Interventions Intervention Not Indicated  Housing Interventions Intervention Not Indicated  Physical Activity Interventions Intervention Not Indicated  Transportation Interventions Intervention Not Indicated       Care Plan  Review of patient past medical  history, allergies, medications, health status, including review of consultants reports, laboratory and other test data, was performed as part of comprehensive evaluation for care management services.   Care Plan : RN Care Manager Plan of Care  Updates made by Jamerion Cabello, Eppie Gibson, RN since 11/28/2021 12:00 AM     Problem: Knowledge [place] Related to Hypertension and Care  Coordination Needs   Priority: High     Long-Range Goal: Development Plan of Care for Management of Hypertension   Start Date: 11/23/2021  Expected End Date: 11/24/2022  Priority: High  Note:   Current Barriers:  Knowledge Deficits related to plan of care for management of HTN   RNCM Clinical Goal(s):  Patient will verbalize understanding of plan for management of HTN as evidenced by blood pressure controlled, medication adherence and low sodium diet  through collaboration with RN Care manager, provider, and care team.   Interventions: Inter-disciplinary care team collaboration (see longitudinal plan of care) Evaluation of current treatment plan related to  self management and patient's adherence to plan as established by provider   Hypertension Interventions:  (Status:  Goal on track:  Yes.) Long Term Goal Last practice recorded BP readings:  BP Readings from Last 3 Encounters:  04/27/21 (!) 123/59  04/17/21 (!) 155/79  07/08/19 137/82  Most recent eGFR/CrCl: No results found for: EGFR  No components found for: CRCL  Evaluation of current treatment plan related to hypertension self management and patient's adherence to plan as established by provider Provided education to patient re: stroke prevention, s/s of heart attack and stroke Reviewed medications with patient and discussed importance of compliance Advised patient to discuss swelling in lower extremities, hands  with provider Provided education on prescribed diet low sodium diet  Patient Goals/Self-Care Activities: Take all medications as prescribed Attend all scheduled provider appointments Call pharmacy for medication refills 3-7 days in advance of running out of medications Attend church or other social activities Perform all self care activities independently  Call provider office for new concerns or questions  call the Suicide and Crisis Lifeline: 988 if experiencing a Mental Health or [place]  check  blood pressure daily learn about high blood pressure take blood pressure log to all doctor appointments call doctor for signs and symptoms of high blood pressure keep all doctor appointments take medications for blood pressure exactly as prescribed report new symptoms to your doctor limit salt intake to 2300 mg/day  Follow Up Plan:  Telephone follow up appointment with care management team member scheduled for:  February 26, 2022 The patient has been provided with contact information for the care management team and has been advised to call with any health related questions or concerns.    [id number] Per patient he is having swelling in lower extremities and hands. He stated that he does have some shortness of breath on moderate exertion. He monitors blood pressure and weight daily. He swims 3x a week for 1 hour. He stated he is currently having some back pain and neck pain. Per patient he is taking his medications as per ordered. His A1C has increased from 5.5 to 6.1. Per patient he thought it was due to the prednisone he was on for 3 months. He does not check blood sugars at home. RN discussed continuing to monitor blood pressure,weight and swelling. RN sent educational material to patient on HTN and CHF.       Plan: Telephone follow up appointment with care management team member scheduled for:  February 26, 2022 The patient has been provided with contact information for the care management team and has been advised to call with any health related questions or concerns.   [facility] Management [phone]

## 2021-12-06 DIAGNOSIS — M47816 Spondylosis without myelopathy or radiculopathy, lumbar region: Secondary | ICD-10-CM | POA: Diagnosis not present

## 2021-12-06 DIAGNOSIS — M545 Low back pain, unspecified: Secondary | ICD-10-CM | POA: Diagnosis not present

## 2021-12-06 DIAGNOSIS — M4316 Spondylolisthesis, lumbar region: Secondary | ICD-10-CM | POA: Diagnosis not present

## 2021-12-06 DIAGNOSIS — G8929 Other chronic pain: Secondary | ICD-10-CM | POA: Diagnosis not present

## 2021-12-06 DIAGNOSIS — M19011 Primary osteoarthritis, right shoulder: Secondary | ICD-10-CM | POA: Diagnosis not present

## 2021-12-06 DIAGNOSIS — M25512 Pain in left shoulder: Secondary | ICD-10-CM | POA: Diagnosis not present

## 2021-12-06 DIAGNOSIS — M25511 Pain in right shoulder: Secondary | ICD-10-CM | POA: Diagnosis not present

## 2021-12-12 DIAGNOSIS — I10 Essential (primary) hypertension: Secondary | ICD-10-CM | POA: Diagnosis not present

## 2021-12-12 DIAGNOSIS — Z6831 Body mass index (BMI) 31.0-31.9, adult: Secondary | ICD-10-CM | POA: Diagnosis not present

## 2021-12-24 DIAGNOSIS — M545 Low back pain, unspecified: Secondary | ICD-10-CM | POA: Diagnosis not present

## 2021-12-24 DIAGNOSIS — M25512 Pain in left shoulder: Secondary | ICD-10-CM | POA: Diagnosis not present

## 2021-12-24 DIAGNOSIS — M25511 Pain in right shoulder: Secondary | ICD-10-CM | POA: Diagnosis not present

## 2021-12-24 DIAGNOSIS — G8929 Other chronic pain: Secondary | ICD-10-CM | POA: Diagnosis not present

## 2021-12-27 ENCOUNTER — Ambulatory Visit (INDEPENDENT_AMBULATORY_CARE_PROVIDER_SITE_OTHER): Payer: Medicare Other | Admitting: Interventional Cardiology

## 2021-12-27 ENCOUNTER — Encounter: Payer: Self-pay | Admitting: Interventional Cardiology

## 2021-12-27 VITALS — BP 168/82 | HR 61 | Ht 72.0 in | Wt 233.0 lb

## 2021-12-27 DIAGNOSIS — E782 Mixed hyperlipidemia: Secondary | ICD-10-CM | POA: Diagnosis not present

## 2021-12-27 DIAGNOSIS — I10 Essential (primary) hypertension: Secondary | ICD-10-CM

## 2021-12-27 DIAGNOSIS — R600 Localized edema: Secondary | ICD-10-CM

## 2021-12-27 MED ORDER — FUROSEMIDE 40 MG PO TABS: 40.0000 mg | ORAL_TABLET | Freq: Every day | ORAL | 3 refills | Status: DC | PRN

## 2021-12-27 MED ORDER — AMLODIPINE BESYLATE 10 MG PO TABS: ORAL_TABLET | ORAL | 3 refills | Status: DC

## 2021-12-27 NOTE — Patient Instructions (Addendum)
Medication Instructions:  Your physician has recommended you make the following change in your medication:  1-Take Lasix (furosemide) 40 mg by mouth daily as needed for edema. 2-Take amlodipine (norvasc) 5 mg (1/2 tablet) by mouth daily, you can increase to 10 mg by mouth daily if blood pressure is still high. Goal is for blood pressure to be less than 140/90.   *If you need a refill on your cardiac medications before your next appointment, please call your pharmacy*  Lab Work: Your physician recommends that you return for lab work in: 4 weeks for BMET  If you have labs (blood work) drawn today and your tests are completely normal, you will receive your results only by: [place] (if you have MyChart) OR A paper copy in the mail If you have any lab test that is abnormal or we need to change your treatment, we will call you to review the results.  Follow-Up: At [facility], you and your health needs are our priority.  As part of our continuing mission to provide you with exceptional heart care, we have created designated Provider Care Teams.  These Care Teams include your primary Cardiologist (physician) and Advanced Practice Providers (APPs -  Physician Assistants and Nurse Practitioners) who all work together to provide you with the care you need, when you need it.  We recommend signing up for the patient portal called "MyChart".  Sign up information is provided on this After Visit Summary.  MyChart is used to connect with patients for Virtual Visits (Telemedicine).  Patients are able to view lab/test results, encounter notes, upcoming appointments, etc.  Non-urgent messages can be sent to your provider as well.   To learn more about what you can do with MyChart, go to [email or web address].    Your next appointment:   4 week(s)  The format for your next appointment:   In Person  Provider:   Hypertension [facility]   [place]

## 2021-12-27 NOTE — Addendum Note (Signed)
Addended by: Aris Georgia, Ronte Parker L on: 12/27/2021 04:51 PM   Modules accepted: Orders

## 2021-12-27 NOTE — Progress Notes (Signed)
Cardiology Office Note   Date:  12/27/2021   ID:  Alan Snyder, DOB 02/16/1946, MRN [id number]  PCP:  Lawson Radar, PA    No chief complaint on file.  Leg edema, HTN  Wt Readings from Last 3 Encounters:  12/27/21 233 lb (105.7 kg)  04/26/21 227 lb (103 kg)  04/17/21 227 lb (103 kg)       History of Present Illness: Alan Snyder is a 76 y.o. male who is being seen today for the evaluation of leg edema, HTN at the request of Maryella Shivers, MD.   BP has been going to the 180/100 range for the past 1-2 weeks.    Was controlled on Diovan/HCTZ but was switched to Diovan in 2022 due to Cr increasing in the setting of hip surgery.  He was taking a lot of ibuprofen.  He has been more of late due to back pain.   Leg swelling has been going on for several weeks.  Amlodipine was added for blood pressure after the swelling started.  The amlodipine was then stopped due to persistent edema.  He does elevate his legs.  He feels his legs are firm and itchy since the fluid issue.  He has been on furosemide in the past but has not taken it recently.  He continues to swim regularly without any cardiac symptoms.  Denies : Chest pain. Dizziness.  Nitroglycerin use. Orthopnea. Palpitations. Paroxysmal nocturnal dyspnea. Shortness of breath. Syncope.     Past Medical History:  Diagnosis Date   Arthritis    Cancer ([place])    PROSTATE CANCER   Chronic pain    Diastolic dysfunction    Edema    Elevated PSA    Hypertension    Mixed hyperlipidemia    PONV (postoperative nausea and vomiting)     Past Surgical History:  Procedure Laterality Date   PROSTATECTOMY     TOTAL HIP ARTHROPLASTY Left    TOTAL HIP ARTHROPLASTY Right 04/26/2021   Procedure: TOTAL HIP ARTHROPLASTY ANTERIOR APPROACH;  Surgeon: Paralee Cancel, MD;  Location: [facility] ORS;  Service: Orthopedics;  Laterality: Right;     Current Outpatient Medications  Medication Sig Dispense Refill   amLODipine (NORVASC) 2.5 MG tablet  Take 2.5 mg by mouth daily.     diclofenac (VOLTAREN) 75 MG EC tablet Take 75 mg by mouth 2 (two) times daily.     docusate sodium (COLACE) 100 MG capsule Take 1 capsule (100 mg total) by mouth 2 (two) times daily. 10 capsule 0   ezetimibe (ZETIA) 10 MG tablet Take 10 mg by mouth daily.     fenofibrate (TRICOR) 145 MG tablet Take 145 mg by mouth daily.     gabapentin (NEURONTIN) 300 MG capsule Take 600-900 mg by mouth See admin instructions. Take 600 mg by mouth in the morning and 900 mg at night     methocarbamol (ROBAXIN) 500 MG tablet Take 1 tablet (500 mg total) by mouth every 6 (six) hours as needed for muscle spasms. 40 tablet 0   Multiple Vitamin (MULTIVITAMIN WITH MINERALS) TABS tablet Take 1 tablet by mouth daily.     nebivolol (BYSTOLIC) 5 MG tablet Take 5 mg by mouth daily.     Omega-3 Fatty Acids (FISH OIL) 1000 MG CAPS Take 2,000 mg by mouth daily at 6 (six) AM.     traZODone (DESYREL) 50 MG tablet Take 50-150 mg by mouth at bedtime.     valsartan (DIOVAN) 320 MG tablet Take 320 mg by  mouth daily.     vitamin B-12 (CYANOCOBALAMIN) 1000 MCG tablet Take 1 tablet by mouth daily.     No current facility-administered medications for this visit.    Allergies:   Morphine and related    Social History:  The patient  reports that he has quit smoking. His smoking use included cigarettes. He has never used smokeless tobacco. He reports current alcohol use. He reports that he does not use drugs.   Family History:  The patient's family history includes Heart failure in his mother.    ROS:  Please see the history of present illness.   Otherwise, review of systems are positive for leg edema.   All other systems are reviewed and negative.    PHYSICAL EXAM: VS:  BP (!) 168/82   Pulse 61   Ht 6' (1.829 m)   Wt 233 lb (105.7 kg)   SpO2 96%   BMI 31.60 kg/m  , BMI Body mass index is 31.6 kg/m. GEN: Well nourished, well developed, in no acute distress HEENT: normal Neck: no JVD,  carotid bruits, or masses Cardiac: RRR; no murmurs, rubs, or gallops,; 1+ firm edema in both legs  Respiratory:  clear to auscultation bilaterally, normal work of breathing GI: soft, nontender, nondistended, + BS MS: no deformity or atrophy Skin: warm and dry, no rash Neuro:  Strength and sensation are intact Psych: euthymic mood, full affect   EKG:   The ekg ordered today demonstrates normal sinus rhythm with nonspecific ST-T wave flattening   Recent Labs: 04/17/2021: ALT 28 04/27/2021: BUN 27; Creatinine, Ser 1.13; Hemoglobin 13.5; Platelets 204; Potassium 4.3; Sodium 138   Lipid Panel No results found for: "CHOL", "TRIG", "HDL", "CHOLHDL", "VLDL", "LDLCALC", "LDLDIRECT"   Other studies Reviewed: Additional studies/ records that were reviewed today with results demonstrating: Labs reviewed.  Potassium 4.3 on Nov 17, 2021   ASSESSMENT AND PLAN:  Bilateral leg edema: He states he had an echocardiogram and he was told that everything was okay.  His wife states there were perhaps some minor abnormalities.  I do not have that record available to me.  We will try to get this from [facility] or from his primary care doctor.  Okay to start Lasix 40 mg daily as needed.  I suspect, he will need a few doses and this should help his legs.  Elevate legs.  Creatinine 1.27 at the end of May 2023. Hypertension: Continue Diovan 320 mg daily.  Could consider switching to a different ARB since he has been on Diovan for a long time.  Continue Bystolic 10 mg daily.  Heart rate 61.  I do not want to increase the Bystolic.  We will add amlodipine 5 mg daily.  I think likely he will need to go to 10.  We will have him follow-up with Pharm.D. hypertension clinic.  He will report home blood pressure readings back to [place].  Repeat blood pressure check by me in the office did show some improvement. Hyperlipidemia: He is currently taking Zetia and Tricor.  At some point, he may benefit from a calcium scoring  test to assess his risk and see how aggressive we need to be with his lipids.  I do not have current lipids available.   Current medicines are reviewed at length with the patient today.  The patient concerns regarding his medicines were addressed.  The following changes have been made: As above  Labs/ tests ordered today include:  No orders of the defined types were  placed in this encounter.   Recommend 150 minutes/week of aerobic exercise Low fat, low carb, high fiber diet recommended  Disposition:   FU in 3-4 weeks, PharmD HTN   Signed, Larae Grooms, MD  12/27/2021 3:14 PM    [facility] [place], [place], [place]  [address] Phone: [phone]; Fax: [phone]

## 2022-01-01 DIAGNOSIS — G8929 Other chronic pain: Secondary | ICD-10-CM | POA: Diagnosis not present

## 2022-01-01 DIAGNOSIS — M25512 Pain in left shoulder: Secondary | ICD-10-CM | POA: Diagnosis not present

## 2022-01-01 DIAGNOSIS — M545 Low back pain, unspecified: Secondary | ICD-10-CM | POA: Diagnosis not present

## 2022-01-01 DIAGNOSIS — M25511 Pain in right shoulder: Secondary | ICD-10-CM | POA: Diagnosis not present

## 2022-01-08 DIAGNOSIS — M25511 Pain in right shoulder: Secondary | ICD-10-CM | POA: Diagnosis not present

## 2022-01-08 DIAGNOSIS — M545 Low back pain, unspecified: Secondary | ICD-10-CM | POA: Diagnosis not present

## 2022-01-08 DIAGNOSIS — G8929 Other chronic pain: Secondary | ICD-10-CM | POA: Diagnosis not present

## 2022-01-08 DIAGNOSIS — M25512 Pain in left shoulder: Secondary | ICD-10-CM | POA: Diagnosis not present

## 2022-01-10 DIAGNOSIS — G8929 Other chronic pain: Secondary | ICD-10-CM | POA: Diagnosis not present

## 2022-01-10 DIAGNOSIS — M545 Low back pain, unspecified: Secondary | ICD-10-CM | POA: Diagnosis not present

## 2022-01-10 DIAGNOSIS — M25511 Pain in right shoulder: Secondary | ICD-10-CM | POA: Diagnosis not present

## 2022-01-10 DIAGNOSIS — M25512 Pain in left shoulder: Secondary | ICD-10-CM | POA: Diagnosis not present

## 2022-01-15 DIAGNOSIS — M25511 Pain in right shoulder: Secondary | ICD-10-CM | POA: Diagnosis not present

## 2022-01-15 DIAGNOSIS — M545 Low back pain, unspecified: Secondary | ICD-10-CM | POA: Diagnosis not present

## 2022-01-15 DIAGNOSIS — M25512 Pain in left shoulder: Secondary | ICD-10-CM | POA: Diagnosis not present

## 2022-01-15 DIAGNOSIS — G8929 Other chronic pain: Secondary | ICD-10-CM | POA: Diagnosis not present

## 2022-01-16 ENCOUNTER — Other Ambulatory Visit: Payer: Self-pay | Admitting: *Deleted

## 2022-01-16 NOTE — Patient Outreach (Signed)
[facility]) Care Management  01/16/2022  Alan Snyder [birth year] [id number]   RN Health Coach telephone call to patient.  [place] compliance verified.  [phone] Patient last office visit BP 168/82. Patient continues to need follow up care. RN Health case closure. RN referred to Care coordinator to continue care. Plan: RN Health Coach Case Closure  Refer to Care Coordinator  [place] Management [phone]

## 2022-01-24 DIAGNOSIS — M25511 Pain in right shoulder: Secondary | ICD-10-CM | POA: Diagnosis not present

## 2022-01-24 DIAGNOSIS — M25612 Stiffness of left shoulder, not elsewhere classified: Secondary | ICD-10-CM | POA: Diagnosis not present

## 2022-01-24 DIAGNOSIS — R293 Abnormal posture: Secondary | ICD-10-CM | POA: Diagnosis not present

## 2022-01-24 DIAGNOSIS — M2560 Stiffness of unspecified joint, not elsewhere classified: Secondary | ICD-10-CM | POA: Diagnosis not present

## 2022-01-24 DIAGNOSIS — M25512 Pain in left shoulder: Secondary | ICD-10-CM | POA: Diagnosis not present

## 2022-01-24 DIAGNOSIS — R2689 Other abnormalities of gait and mobility: Secondary | ICD-10-CM | POA: Diagnosis not present

## 2022-01-24 DIAGNOSIS — M6281 Muscle weakness (generalized): Secondary | ICD-10-CM | POA: Diagnosis not present

## 2022-01-24 DIAGNOSIS — G8929 Other chronic pain: Secondary | ICD-10-CM | POA: Diagnosis not present

## 2022-01-24 DIAGNOSIS — M25611 Stiffness of right shoulder, not elsewhere classified: Secondary | ICD-10-CM | POA: Diagnosis not present

## 2022-01-24 DIAGNOSIS — M25551 Pain in right hip: Secondary | ICD-10-CM | POA: Diagnosis not present

## 2022-01-24 DIAGNOSIS — M545 Low back pain, unspecified: Secondary | ICD-10-CM | POA: Diagnosis not present

## 2022-01-28 NOTE — Progress Notes (Unsigned)
Patient ID: [name]                 DOB: [birth year]                      MRN: [id number]     HPI: Alan Snyder is a 76 y.o. male referred by Dr. Irish Lack to HTN clinic. PMH is significant for HTN, HLD, diastolic dysfunction and edema. Patient was previously controlled on valsartan-HCTZ but this was stopped due to Scr elevations in the setting of hip surgery with patient taking ibuprofen. Recently he has been experiencing leg swelling. The patient was started on amlodipine after swelling occurred which was later stopped due to persistent edema. At recent visit with Dr. Irish Lack, amlodipine 5 mg was added again with plan to continue nebivolol 10 mg and valsartan 320 mg daily.  Ask about home BP readings ask about diet  Most recent BMP 2022 - Scr 1.13  Ask pt about leg swelling, if it has improved on lasix I dont think amlodipine is the best option for this pt even if swelling started prior to amlodipine - could worsen. Maybe stop amlodipine,  Would not increase BB with hr of 61. Consider low dose thiazide vs spiro?  I see diastolic dysfunction on pts problem list - perhaps swelling due to HF? I cant find other info, pt reported ECHO was okay Dr. Irish Lack mentioned pt may benefit from calcium scoring test (on fenofibrate, and zetia, no recent lipid panel)    Current HTN meds: amlodipine 5 mg daily, valsartan 320 mg daily, nebivolol 10 mg daily, lasix 40 mg daily PRN edema Previously tried: irbesartan 300 mg daily, valsartan-HCTZ '320mg'$ -'25mg'$  daily (increased Scr) BP goal: <130/80  Family History: mother- heart failuer   Social History: quit smoking, drinks alcohol   Diet:   Exercise: swims regularly   Home BP readings:   Wt Readings from Last 3 Encounters:  12/27/21 233 lb (105.7 kg)  04/26/21 227 lb (103 kg)  04/17/21 227 lb (103 kg)   BP Readings from Last 3 Encounters:  12/27/21 (!) 168/82  04/27/21 (!) 123/59  04/17/21 (!) 155/79   Pulse Readings from Last 3 Encounters:   12/27/21 61  04/27/21 72  04/17/21 63    Renal function: CrCl cannot be calculated (Patient's most recent lab result is older than the maximum 21 days allowed.).  Past Medical History:  Diagnosis Date   Arthritis    Cancer ([place])    PROSTATE CANCER   Chronic pain    Diastolic dysfunction    Edema    Elevated PSA    Hypertension    Mixed hyperlipidemia    PONV (postoperative nausea and vomiting)     Current Outpatient Medications on File Prior to Visit  Medication Sig Dispense Refill   amLODipine (NORVASC) 10 MG tablet Take 5 mg (1/2 tablet) by mouth daily, increase to 10 mg by mouth daily if blood pressure continues to be elevated. 90 tablet 3   diclofenac (VOLTAREN) 75 MG EC tablet Take 75 mg by mouth 2 (two) times daily.     docusate sodium (COLACE) 100 MG capsule Take 1 capsule (100 mg total) by mouth 2 (two) times daily. 10 capsule 0   ezetimibe (ZETIA) 10 MG tablet Take 10 mg by mouth daily.     fenofibrate (TRICOR) 145 MG tablet Take 145 mg by mouth daily.     furosemide (LASIX) 40 MG tablet Take 1 tablet (40 mg total) by mouth daily  as needed for edema or fluid. 90 tablet 3   gabapentin (NEURONTIN) 300 MG capsule Take 600-900 mg by mouth See admin instructions. Take 600 mg by mouth in the morning and 900 mg at night     methocarbamol (ROBAXIN) 500 MG tablet Take 1 tablet (500 mg total) by mouth every 6 (six) hours as needed for muscle spasms. 40 tablet 0   Multiple Vitamin (MULTIVITAMIN WITH MINERALS) TABS tablet Take 1 tablet by mouth daily.     nebivolol (BYSTOLIC) 5 MG tablet Take 5 mg by mouth daily.     Omega-3 Fatty Acids (FISH OIL) 1000 MG CAPS Take 2,000 mg by mouth daily at 6 (six) AM.     traZODone (DESYREL) 50 MG tablet Take 50-150 mg by mouth at bedtime.     valsartan (DIOVAN) 320 MG tablet Take 320 mg by mouth daily.     vitamin B-12 (CYANOCOBALAMIN) 1000 MCG tablet Take 1 tablet by mouth daily.     No current facility-administered medications on file prior  to visit.    Allergies  Allergen Reactions   Morphine And Related Itching    There were no vitals taken for this visit.   Assessment/Plan:  1. Hypertension -    Thank you  Ramond Dial, Pharm.D, BCPS, CPP [place]  [address], [place], [place] [address]  Phone: [phone]; Fax: [phone]

## 2022-01-29 ENCOUNTER — Other Ambulatory Visit: Payer: Medicare Other

## 2022-01-29 ENCOUNTER — Ambulatory Visit (INDEPENDENT_AMBULATORY_CARE_PROVIDER_SITE_OTHER): Payer: Medicare Other | Admitting: Pharmacist

## 2022-01-29 VITALS — BP 138/80 | HR 70

## 2022-01-29 DIAGNOSIS — R600 Localized edema: Secondary | ICD-10-CM | POA: Diagnosis not present

## 2022-01-29 DIAGNOSIS — E782 Mixed hyperlipidemia: Secondary | ICD-10-CM | POA: Diagnosis not present

## 2022-01-29 DIAGNOSIS — I10 Essential (primary) hypertension: Secondary | ICD-10-CM | POA: Diagnosis not present

## 2022-01-29 LAB — BASIC METABOLIC PANEL
BUN/Creatinine Ratio: 21 (ref 10–24)
BUN: 34 mg/dL — ABNORMAL HIGH (ref 8–27)
CO2: 24 mmol/L (ref 20–29)
Calcium: 10.1 mg/dL (ref 8.6–10.2)
Chloride: 103 mmol/L (ref 96–106)
Creatinine, Ser: 1.59 mg/dL — ABNORMAL HIGH (ref 0.76–1.27)
Glucose: 112 mg/dL — ABNORMAL HIGH (ref 70–99)
Potassium: 4.5 mmol/L (ref 3.5–5.2)
Sodium: 143 mmol/L (ref 134–144)
eGFR: 45 mL/min/{1.73_m2} — ABNORMAL LOW (ref >59)

## 2022-01-29 NOTE — Patient Instructions (Addendum)
BP goal <130/80. Take Diovan and Bystolic in the evening.  Keep taking Norvasc in the morning.  Watch salt intake in diet. Avoid adding salt to foods. Mrs. Deliah Boston is a good alternative. Can use other seasonings that do not have salt. Aim for daily salt intake <2 g. Compression stockings can help with leg swelling.  Ibuprofen and voltaren can increase blood pressure and affect your kidneys - try to limit taking. Could try taking an extra tylenol or two to cut back on NSAIDs.  We will call tomorrow to discuss lab results and any medication changes.

## 2022-01-30 ENCOUNTER — Telehealth: Payer: Self-pay

## 2022-01-30 DIAGNOSIS — Z96641 Presence of right artificial hip joint: Secondary | ICD-10-CM | POA: Diagnosis not present

## 2022-01-30 DIAGNOSIS — M25551 Pain in right hip: Secondary | ICD-10-CM | POA: Diagnosis not present

## 2022-01-30 NOTE — Telephone Encounter (Signed)
Called patient to review lab results and left message:   Patient's Scr increased from 1.13 to 1.59 after recently starting Lasix 40 mg and amlodipine 10 mg daily on July 6th with no improvement in lower extremity edema. We will plan to stop both amlodipine and Lasix and replace with spironolactone 25 mg daily. Will have patient continue valsartan 320 mg and nebivolol 10 mg daily. Will plan for close follow up to recheck BP and BMET in 2 weeks.   Will also ask patient to clarify prior statin history (outside records show was on pravastatin 20 mg in 2016). Yesterday we also reviewed his lumbar spine xray from [facility] which showed significant atherosclerotic calcifications of the abdominal aorta. Most recent LDL-C from 10/19/21 is 76 and would like him to be <70. Can discuss re-challenging with low dose rosuvastatin if patient is willing.

## 2022-01-31 DIAGNOSIS — M25511 Pain in right shoulder: Secondary | ICD-10-CM | POA: Diagnosis not present

## 2022-01-31 DIAGNOSIS — M545 Low back pain, unspecified: Secondary | ICD-10-CM | POA: Diagnosis not present

## 2022-01-31 DIAGNOSIS — M25512 Pain in left shoulder: Secondary | ICD-10-CM | POA: Diagnosis not present

## 2022-01-31 DIAGNOSIS — G8929 Other chronic pain: Secondary | ICD-10-CM | POA: Diagnosis not present

## 2022-01-31 DIAGNOSIS — M25551 Pain in right hip: Secondary | ICD-10-CM | POA: Diagnosis not present

## 2022-01-31 DIAGNOSIS — M2560 Stiffness of unspecified joint, not elsewhere classified: Secondary | ICD-10-CM | POA: Diagnosis not present

## 2022-01-31 MED ORDER — SPIRONOLACTONE 25 MG PO TABS: 25.0000 mg | ORAL_TABLET | Freq: Every day | ORAL | 5 refills | Status: DC

## 2022-01-31 NOTE — Telephone Encounter (Signed)
Spoke with patient and relayed below med changes.  Patient also informed that he stopped taking ibuprofen but is still needing to take voltaren for pain.We discussed that he could ask his orthopedist about tramadol which may help him take less NSAIDs and discussed that taking aspirin 81 mg would be okay and not affect his kidneys as much. When asked about if he ever has taken a statin he says that in the past he was on a statin for less than 1 week but was unable to tolerate it. He will come for follow up appointment on 8/24 at 11am to recheck BP and labs.

## 2022-02-01 DIAGNOSIS — M5126 Other intervertebral disc displacement, lumbar region: Secondary | ICD-10-CM | POA: Diagnosis not present

## 2022-02-01 DIAGNOSIS — M9903 Segmental and somatic dysfunction of lumbar region: Secondary | ICD-10-CM | POA: Diagnosis not present

## 2022-02-06 DIAGNOSIS — M9903 Segmental and somatic dysfunction of lumbar region: Secondary | ICD-10-CM | POA: Diagnosis not present

## 2022-02-06 DIAGNOSIS — M5126 Other intervertebral disc displacement, lumbar region: Secondary | ICD-10-CM | POA: Diagnosis not present

## 2022-02-08 DIAGNOSIS — M5126 Other intervertebral disc displacement, lumbar region: Secondary | ICD-10-CM | POA: Diagnosis not present

## 2022-02-08 DIAGNOSIS — M9903 Segmental and somatic dysfunction of lumbar region: Secondary | ICD-10-CM | POA: Diagnosis not present

## 2022-02-12 DIAGNOSIS — M545 Low back pain, unspecified: Secondary | ICD-10-CM | POA: Diagnosis not present

## 2022-02-12 DIAGNOSIS — M25511 Pain in right shoulder: Secondary | ICD-10-CM | POA: Diagnosis not present

## 2022-02-12 DIAGNOSIS — G8929 Other chronic pain: Secondary | ICD-10-CM | POA: Diagnosis not present

## 2022-02-12 DIAGNOSIS — M2560 Stiffness of unspecified joint, not elsewhere classified: Secondary | ICD-10-CM | POA: Diagnosis not present

## 2022-02-12 DIAGNOSIS — M25512 Pain in left shoulder: Secondary | ICD-10-CM | POA: Diagnosis not present

## 2022-02-12 DIAGNOSIS — M25551 Pain in right hip: Secondary | ICD-10-CM | POA: Diagnosis not present

## 2022-02-13 NOTE — Progress Notes (Unsigned)
Patient ID: Alan Snyder                 DOB: December 18, 1945                      MRN: [id number]     HPI: Alan Snyder is a 76 y.o. male referred by Dr. Irish Lack to HTN clinic. PMH is significant for HTN, HLD, diastolic dysfunction and edema. Patient was previously controlled on valsartan-HCTZ but this was changed to just valsartan in the fall of 2022 due to Scr elevations in the setting of hip surgery with patient taking ibuprofen. In September 2022 Scr was 1.6, then improved to 1.13 in November 2022. His most recent Scr on 11/26/21 was 1.27 and stable since May 2023. Pt was referred to cardiology 12/2021 after reporting leg swelling for a few months. Echo May 2023 revealed some diastolic dysfunction. Pt initially saw Dr Irish Lack on 12/27/21. Pt reported BPs 180/100s for 1-2 weeks prior to this visit accompanied by headaches. BP during visit was 168/82 and pt was started on amlodipine 5 mg daily (with instructions to increase to 10 mg daily if BP remains elevated) and Lasix 40 mg daily as needed.  Pt seen at HTN clinic 01/29/22. Reports increasing his nebivolol dose from '5mg'$  to '10mg'$  about a month ago, and increased his amlodipine from '5mg'$  to '10mg'$  shortly after it was prescribed, and taking Lasix 40 mg daily (not PRN). Patient shared that initial swelling seemed to come out of nowhere and initially started a few months ago. Reports his PCP started him on a low dose of amlodipine a few months ago and swelling did begin about a week after, but never improved after amlodipine was stopped. He reports that his blood pressure became a problem after his hip surgery (when his valsartan-HCTZ was changed to just valsartan). He shares that his leg swelling is improved from about 3 months ago but still remains a problem. Bilateral pitting edema noted during visit. Home Bps were 140/85 in the morning before taking BP medications, 125/75 after taking medications, and 112/65 later in the day. HR stable 60s-70s. Endorsed proper BP  technique with using wrist cuff. At this time was taking 2 ibuprofen 200 mg tablets at night, 1 diclofenac 75 mg in the morning with 2 Tylenol 500 mg tablets. He also uses voltaren gel. Also mentioned that scan by his pain doctors showed calcifications. Reviewed lumbar spine xray from [facility] 12/06/21 which showed significant atherosclerotic calcifications of the abdominal aorta. BP was 138/80 and valsartan 320 mg daily and nebivolol 10 mg daily were moved to evening, amlodipine 10 mg daily and lasix 40 mg daily in the morning were continued to aim for better 24 hour BP control. BMET showed bump in Scr since starting lasix with no improvement in LEE. Amlodipine and lasix were then discontinued, replaced with spironolactone 25 mg daily with plan to follow up for labs and BP in 2 weeks. Pt at this time also confirmed he stopped taking ibuprofen but is still needing voltaren for pain. Discussed he could ask his orthopedist about tramadol to limit NSAID use. Pt also confirmed statin intolerance in the past, was on pravastatin for less than 1 week.    Pt presents for HTN follow up today. He shares that his swelling is significantly improved since stopping amlodipine and starting spironolactone. He shares that he had lost 7.5 lbs in 3 days when he first started it. Pt states he is not experiencing any side effects  from spironolactone. He feels much better now that his legs are not swollen. He describes that the swelling in his legs was uncomfortable in the past but now it is much easier for him to move around. He has been cutting back on ibuprofen use but is still taken voltaren gel and tablets though less than previously. He takes his BP at home but did not bring in cuff or readings. He shares that his BP last night was 106/58, and generally BP ranges from 462-703 systolics and 50-09F diastolics. He has also started cutting back added salt in diet, trying Mrs. Dash instead. He shares that he never misses his BP  medications but sometimes misses his ezetimibe and fenofibrate, about 4 times a week. Pt confirms intolerance to atorvastatin, he said it made him feel like he had the flu with muscle aches. Pt shared that he also saw PCP this morning and we confirmed that CMP, A1C and lipid panel was obtained.   Current HTN meds: valsartan 320 mg daily, nebivolol 10 mg daily, spironolactone 25 mg daily Previously tried: irbesartan 300 mg daily, valsartan-HCTZ '320mg'$ -'25mg'$  daily (increased Scr) BP goal: <130/80  Current lipid medications: ezetimibe 10 mg daily, fenofibrate 145 mg daily Intolerances: atorvastatin, rosuvastatin (flu-like sx)   LDL goal: <70   Family History: mother- heart failure   Social History: quit smoking, drinks alcohol   Diet: likes salt in food, adds salt to food especially to tomatoes, drinks one cup of coffee daily  has been cutting back on adding salt to food. Has been trying Mrs. Dash   Exercise: swims regularly at aquatic center   Labs: 10/19/21: TC 153, HDL 54, TG 128, LDL 76 (ezetimibe 10 mg daily, fenofibrate 145 mg daily)  TG 347 in 2022 Wt Readings from Last 3 Encounters:  12/27/21 233 lb (105.7 kg)  04/26/21 227 lb (103 kg)  04/17/21 227 lb (103 kg)   BP Readings from Last 3 Encounters:  01/29/22 138/80  12/27/21 (!) 168/82  04/27/21 (!) 123/59   Pulse Readings from Last 3 Encounters:  01/29/22 70  12/27/21 61  04/27/21 72    Renal function: CrCl cannot be calculated (Unknown ideal weight.).  Past Medical History:  Diagnosis Date   Arthritis    Cancer ([place])    PROSTATE CANCER   Chronic pain    Diastolic dysfunction    Edema    Elevated PSA    Hypertension    Mixed hyperlipidemia    PONV (postoperative nausea and vomiting)     Current Outpatient Medications on File Prior to Visit  Medication Sig Dispense Refill   diclofenac (VOLTAREN) 75 MG EC tablet Take 75 mg by mouth daily.     docusate sodium (COLACE) 100 MG capsule Take 1 capsule (100 mg  total) by mouth 2 (two) times daily. 10 capsule 0   ezetimibe (ZETIA) 10 MG tablet Take 10 mg by mouth daily.     fenofibrate (TRICOR) 145 MG tablet Take 145 mg by mouth daily.     gabapentin (NEURONTIN) 300 MG capsule Take 600-900 mg by mouth See admin instructions. Take 600 mg by mouth in the morning and 900 mg at night     methocarbamol (ROBAXIN) 500 MG tablet Take 1 tablet (500 mg total) by mouth every 6 (six) hours as needed for muscle spasms. 40 tablet 0   Multiple Vitamin (MULTIVITAMIN WITH MINERALS) TABS tablet Take 1 tablet by mouth daily.     nebivolol (BYSTOLIC) 10 MG tablet Take 10 mg by  mouth daily.     Omega-3 Fatty Acids (FISH OIL) 1000 MG CAPS Take 2,000 mg by mouth daily at 6 (six) AM.     spironolactone (ALDACTONE) 25 MG tablet Take 1 tablet (25 mg total) by mouth daily. 30 tablet 5   traZODone (DESYREL) 50 MG tablet Take 50-150 mg by mouth at bedtime.     valsartan (DIOVAN) 320 MG tablet Take 320 mg by mouth daily.     vitamin B-12 (CYANOCOBALAMIN) 1000 MCG tablet Take 1 tablet by mouth daily.     No current facility-administered medications on file prior to visit.    Allergies  Allergen Reactions   Morphine And Related Itching    There were no vitals taken for this visit.   Assessment/Plan:  1. Hypertension - BP of  120/68 is at goal. Instructed patient to continue taking valsartan 320 mg daily, nebivolol 10 mg daily, and spironolactone 25 mg daily. We will follow up on CMP that was obtained at PCP to assess for improvement in Scr since Lasix was stopped and to assess Scr and potassium since starting spironolactone. Will call patient to discuss lab results. Encouraged pt to continue adding less salt to food and try to reduce NSAID use. If no changes need to be made, patient can follow up with HTN clinic as needed.   2. Hyperlipidemia - LDL of 76 is above goal of <70, due to abdominal aorta calcifications. Patient is agreeable to trying low dose rosuvastatin 5 mg every  Monday, Wednesday and Friday. Instructed patient to continue taking ezetimibe 10 mg daily and fenofibrate 145 mg daily. Patient states he has close follow up with his PCP. We will repeat labs in 2 months and will book appointment when we call him.   Eliseo Gum, PharmD PGY1 Pharmacy Resident  02/13/2022  3:44 PM   Ramond Dial, Pharm.D, BCPS, CPP [place]  [address], [place], [place] [address]  Phone: [phone]; Fax: [phone]

## 2022-02-14 ENCOUNTER — Ambulatory Visit (INDEPENDENT_AMBULATORY_CARE_PROVIDER_SITE_OTHER): Payer: Medicare Other | Admitting: Pharmacist

## 2022-02-14 VITALS — BP 120/68 | HR 61

## 2022-02-14 DIAGNOSIS — E1140 Type 2 diabetes mellitus with diabetic neuropathy, unspecified: Secondary | ICD-10-CM | POA: Diagnosis not present

## 2022-02-14 DIAGNOSIS — I10 Essential (primary) hypertension: Secondary | ICD-10-CM

## 2022-02-14 DIAGNOSIS — E782 Mixed hyperlipidemia: Secondary | ICD-10-CM | POA: Diagnosis not present

## 2022-02-14 MED ORDER — ROSUVASTATIN CALCIUM 5 MG PO TABS: 5.0000 mg | ORAL_TABLET | ORAL | 3 refills | Status: DC

## 2022-02-14 NOTE — Patient Instructions (Addendum)
Your blood pressure today was 120/68 which is at goal of <130/80.   Continue taking valsartan 320 mg daily, nebivolol 10 mg daily, and spironolactone 25 mg daily.  Your LDL cholesterol is 76. We would like it to be <70.   Start taking rosuvastatin 5 mg Monday, Wednesday and Friday.  Continue taking ezetimibe 10 mg daily and fenofibrate 145 mg daily.   We will follow up on the labs from your PCP and give you a call to discuss those results and adjust your medications if needed.   Continue to reduce added salt in your food and cutting back on NSAIDs (Ibuprofen and Voltaren) as you are able.

## 2022-02-19 ENCOUNTER — Telehealth: Payer: Self-pay

## 2022-02-19 DIAGNOSIS — M25512 Pain in left shoulder: Secondary | ICD-10-CM | POA: Diagnosis not present

## 2022-02-19 DIAGNOSIS — G8929 Other chronic pain: Secondary | ICD-10-CM | POA: Diagnosis not present

## 2022-02-19 DIAGNOSIS — M25551 Pain in right hip: Secondary | ICD-10-CM | POA: Diagnosis not present

## 2022-02-19 DIAGNOSIS — M2560 Stiffness of unspecified joint, not elsewhere classified: Secondary | ICD-10-CM | POA: Diagnosis not present

## 2022-02-19 DIAGNOSIS — I10 Essential (primary) hypertension: Secondary | ICD-10-CM

## 2022-02-19 DIAGNOSIS — M545 Low back pain, unspecified: Secondary | ICD-10-CM | POA: Diagnosis not present

## 2022-02-19 DIAGNOSIS — M25511 Pain in right shoulder: Secondary | ICD-10-CM | POA: Diagnosis not present

## 2022-02-19 NOTE — Telephone Encounter (Signed)
Called pt to discuss labs that were obtained at PCP. Discussed that Scr came back at 1.53 which is slightly improved since pt stopped taking Lasix and starting spironolactone 25 mg daily but is still high. Also discussed that potassium is borderline high at 4.9 and discussed that labs suggest pt was dehydrated. Discussed option of decreasing spironolactone to 12.5 mg daily given that home blood pressures have been 120/70s with one low at 96/48. Patient explained that they have felt so much better without having leg swelling and is hoping to not have to make any changes. Emphasized importance of hydration and drinking more water throughout the day and limiting NSAID use. Scheduled BMET for 9/7. We will call patient back with those results to discuss at this time if Scr remains elevated despite adequate hydration and limiting NSAIDs if we need to decrease spironolactone.   Also reviewed with patient that their LDL came back at 71. Patient says he did not start rosuvastatin 5 mg MWF yet but will start taking today.

## 2022-02-20 DIAGNOSIS — Z6831 Body mass index (BMI) 31.0-31.9, adult: Secondary | ICD-10-CM | POA: Diagnosis not present

## 2022-02-20 DIAGNOSIS — E782 Mixed hyperlipidemia: Secondary | ICD-10-CM | POA: Diagnosis not present

## 2022-02-20 DIAGNOSIS — E1140 Type 2 diabetes mellitus with diabetic neuropathy, unspecified: Secondary | ICD-10-CM | POA: Diagnosis not present

## 2022-02-20 DIAGNOSIS — I10 Essential (primary) hypertension: Secondary | ICD-10-CM | POA: Diagnosis not present

## 2022-02-20 DIAGNOSIS — N1832 Chronic kidney disease, stage 3b: Secondary | ICD-10-CM | POA: Diagnosis not present

## 2022-02-26 ENCOUNTER — Ambulatory Visit: Payer: Medicare Other | Admitting: *Deleted

## 2022-02-26 DIAGNOSIS — R2689 Other abnormalities of gait and mobility: Secondary | ICD-10-CM | POA: Diagnosis not present

## 2022-02-26 DIAGNOSIS — M25612 Stiffness of left shoulder, not elsewhere classified: Secondary | ICD-10-CM | POA: Diagnosis not present

## 2022-02-26 DIAGNOSIS — M2560 Stiffness of unspecified joint, not elsewhere classified: Secondary | ICD-10-CM | POA: Diagnosis not present

## 2022-02-26 DIAGNOSIS — G8929 Other chronic pain: Secondary | ICD-10-CM | POA: Diagnosis not present

## 2022-02-26 DIAGNOSIS — M25611 Stiffness of right shoulder, not elsewhere classified: Secondary | ICD-10-CM | POA: Diagnosis not present

## 2022-02-26 DIAGNOSIS — M25512 Pain in left shoulder: Secondary | ICD-10-CM | POA: Diagnosis not present

## 2022-02-26 DIAGNOSIS — M6281 Muscle weakness (generalized): Secondary | ICD-10-CM | POA: Diagnosis not present

## 2022-02-26 DIAGNOSIS — R293 Abnormal posture: Secondary | ICD-10-CM | POA: Diagnosis not present

## 2022-02-26 DIAGNOSIS — M25551 Pain in right hip: Secondary | ICD-10-CM | POA: Diagnosis not present

## 2022-02-26 DIAGNOSIS — M545 Low back pain, unspecified: Secondary | ICD-10-CM | POA: Diagnosis not present

## 2022-02-26 DIAGNOSIS — M25511 Pain in right shoulder: Secondary | ICD-10-CM | POA: Diagnosis not present

## 2022-02-28 ENCOUNTER — Ambulatory Visit: Payer: Medicare Other | Attending: Interventional Cardiology

## 2022-02-28 DIAGNOSIS — I10 Essential (primary) hypertension: Secondary | ICD-10-CM | POA: Insufficient documentation

## 2022-03-01 ENCOUNTER — Telehealth: Payer: Self-pay | Admitting: Pharmacist

## 2022-03-01 DIAGNOSIS — I10 Essential (primary) hypertension: Secondary | ICD-10-CM

## 2022-03-01 LAB — BASIC METABOLIC PANEL
BUN/Creatinine Ratio: 16 (ref 10–24)
BUN: 27 mg/dL (ref 8–27)
CO2: 20 mmol/L (ref 20–29)
Calcium: 9.8 mg/dL (ref 8.6–10.2)
Chloride: 105 mmol/L (ref 96–106)
Creatinine, Ser: 1.64 mg/dL — ABNORMAL HIGH (ref 0.76–1.27)
Glucose: 81 mg/dL (ref 70–99)
Potassium: 5.2 mmol/L (ref 3.5–5.2)
Sodium: 140 mmol/L (ref 134–144)
eGFR: 43 mL/min/{1.73_m2} — ABNORMAL LOW (ref >59)

## 2022-03-01 NOTE — Telephone Encounter (Signed)
-----   Message from Jettie Booze, MD sent at 03/01/2022 11:52 AM EDT ----- I agree with decreased spironolactone to see if K and Cr improve.

## 2022-03-01 NOTE — Telephone Encounter (Signed)
Spoke with patient. Will decrease spironolactone to 12.'5mg'$  daily. He will come for repeat labs in 9/14.

## 2022-03-01 NOTE — Telephone Encounter (Signed)
Called pt to review labs results. LVM for pt to call back.

## 2022-03-06 DIAGNOSIS — Z96641 Presence of right artificial hip joint: Secondary | ICD-10-CM | POA: Diagnosis not present

## 2022-03-06 DIAGNOSIS — M25551 Pain in right hip: Secondary | ICD-10-CM | POA: Diagnosis not present

## 2022-03-06 NOTE — Telephone Encounter (Signed)
Patient called stating his swelling has increase since decreasing spironolactone. He is now willing to stop the voltaren. I advised that we can try increasing spironolactone back to '25mg'$  if he stops all NSAIDs. I think this is the cause of the bump in his scr. He will reduce the high K foods in his diet. Recheck labs on 9/20.

## 2022-03-07 ENCOUNTER — Other Ambulatory Visit: Payer: Medicare Other

## 2022-03-07 DIAGNOSIS — G8929 Other chronic pain: Secondary | ICD-10-CM | POA: Diagnosis not present

## 2022-03-07 DIAGNOSIS — M25511 Pain in right shoulder: Secondary | ICD-10-CM | POA: Diagnosis not present

## 2022-03-07 DIAGNOSIS — M25512 Pain in left shoulder: Secondary | ICD-10-CM | POA: Diagnosis not present

## 2022-03-07 DIAGNOSIS — M2560 Stiffness of unspecified joint, not elsewhere classified: Secondary | ICD-10-CM | POA: Diagnosis not present

## 2022-03-07 DIAGNOSIS — M25551 Pain in right hip: Secondary | ICD-10-CM | POA: Diagnosis not present

## 2022-03-07 DIAGNOSIS — M545 Low back pain, unspecified: Secondary | ICD-10-CM | POA: Diagnosis not present

## 2022-03-13 ENCOUNTER — Ambulatory Visit: Payer: Medicare Other | Attending: Interventional Cardiology

## 2022-03-13 DIAGNOSIS — I10 Essential (primary) hypertension: Secondary | ICD-10-CM | POA: Diagnosis not present

## 2022-03-14 ENCOUNTER — Telehealth: Payer: Self-pay | Admitting: Pharmacist

## 2022-03-14 DIAGNOSIS — I10 Essential (primary) hypertension: Secondary | ICD-10-CM

## 2022-03-14 LAB — BASIC METABOLIC PANEL
BUN/Creatinine Ratio: 27 — ABNORMAL HIGH (ref 10–24)
BUN: 50 mg/dL — ABNORMAL HIGH (ref 8–27)
CO2: 22 mmol/L (ref 20–29)
Calcium: 9.9 mg/dL (ref 8.6–10.2)
Chloride: 103 mmol/L (ref 96–106)
Creatinine, Ser: 1.84 mg/dL — ABNORMAL HIGH (ref 0.76–1.27)
Glucose: 123 mg/dL — ABNORMAL HIGH (ref 70–99)
Potassium: 4.7 mmol/L (ref 3.5–5.2)
Sodium: 140 mmol/L (ref 134–144)
eGFR: 38 mL/min/{1.73_m2} — ABNORMAL LOW (ref >59)

## 2022-03-14 NOTE — Telephone Encounter (Addendum)
Alan Snyder is much better but Scr is up. Patient has been taking spironolactone '25mg'$  daily. BP and swelling have been good. He isnt taking any NSAIDS. Labs look like he was dehydrated. Patient states he just swan 1.5 miles prior to labs. Patient will hydrate and we will repeat labs on 9/27.

## 2022-03-19 DIAGNOSIS — M25551 Pain in right hip: Secondary | ICD-10-CM | POA: Diagnosis not present

## 2022-03-19 DIAGNOSIS — G8929 Other chronic pain: Secondary | ICD-10-CM | POA: Diagnosis not present

## 2022-03-19 DIAGNOSIS — M25511 Pain in right shoulder: Secondary | ICD-10-CM | POA: Diagnosis not present

## 2022-03-19 DIAGNOSIS — M545 Low back pain, unspecified: Secondary | ICD-10-CM | POA: Diagnosis not present

## 2022-03-19 DIAGNOSIS — M25512 Pain in left shoulder: Secondary | ICD-10-CM | POA: Diagnosis not present

## 2022-03-19 DIAGNOSIS — M2560 Stiffness of unspecified joint, not elsewhere classified: Secondary | ICD-10-CM | POA: Diagnosis not present

## 2022-03-20 ENCOUNTER — Ambulatory Visit: Payer: Medicare Other | Attending: Interventional Cardiology

## 2022-03-20 DIAGNOSIS — I10 Essential (primary) hypertension: Secondary | ICD-10-CM | POA: Diagnosis not present

## 2022-03-20 DIAGNOSIS — Z23 Encounter for immunization: Secondary | ICD-10-CM | POA: Diagnosis not present

## 2022-03-21 ENCOUNTER — Telehealth: Payer: Self-pay | Admitting: Pharmacist

## 2022-03-21 DIAGNOSIS — C61 Malignant neoplasm of prostate: Secondary | ICD-10-CM | POA: Diagnosis not present

## 2022-03-21 DIAGNOSIS — E291 Testicular hypofunction: Secondary | ICD-10-CM | POA: Diagnosis not present

## 2022-03-21 DIAGNOSIS — Z79899 Other long term (current) drug therapy: Secondary | ICD-10-CM | POA: Diagnosis not present

## 2022-03-21 DIAGNOSIS — N529 Male erectile dysfunction, unspecified: Secondary | ICD-10-CM | POA: Diagnosis not present

## 2022-03-21 DIAGNOSIS — N390 Urinary tract infection, site not specified: Secondary | ICD-10-CM | POA: Diagnosis not present

## 2022-03-21 LAB — BASIC METABOLIC PANEL
BUN/Creatinine Ratio: 21 (ref 10–24)
BUN: 35 mg/dL — ABNORMAL HIGH (ref 8–27)
CO2: 23 mmol/L (ref 20–29)
Calcium: 9.9 mg/dL (ref 8.6–10.2)
Chloride: 103 mmol/L (ref 96–106)
Creatinine, Ser: 1.64 mg/dL — ABNORMAL HIGH (ref 0.76–1.27)
Glucose: 108 mg/dL — ABNORMAL HIGH (ref 70–99)
Potassium: 4.7 mmol/L (ref 3.5–5.2)
Sodium: 139 mmol/L (ref 134–144)
eGFR: 43 mL/min/{1.73_m2} — ABNORMAL LOW (ref >59)

## 2022-03-21 NOTE — Telephone Encounter (Signed)
Scr has improved, but is still higher than it was 11 months ago. Spoke with patient. He was at his urologist office today and showed him the labs. He is doing lab work and is going to do an ultrasound of his kidneys. No changes in medications needed at this time.

## 2022-03-28 DIAGNOSIS — M25612 Stiffness of left shoulder, not elsewhere classified: Secondary | ICD-10-CM | POA: Diagnosis not present

## 2022-03-28 DIAGNOSIS — M6281 Muscle weakness (generalized): Secondary | ICD-10-CM | POA: Diagnosis not present

## 2022-03-28 DIAGNOSIS — M2560 Stiffness of unspecified joint, not elsewhere classified: Secondary | ICD-10-CM | POA: Diagnosis not present

## 2022-03-28 DIAGNOSIS — R2689 Other abnormalities of gait and mobility: Secondary | ICD-10-CM | POA: Diagnosis not present

## 2022-03-28 DIAGNOSIS — G8929 Other chronic pain: Secondary | ICD-10-CM | POA: Diagnosis not present

## 2022-03-28 DIAGNOSIS — M25512 Pain in left shoulder: Secondary | ICD-10-CM | POA: Diagnosis not present

## 2022-03-28 DIAGNOSIS — R293 Abnormal posture: Secondary | ICD-10-CM | POA: Diagnosis not present

## 2022-03-28 DIAGNOSIS — M25511 Pain in right shoulder: Secondary | ICD-10-CM | POA: Diagnosis not present

## 2022-03-28 DIAGNOSIS — M545 Low back pain, unspecified: Secondary | ICD-10-CM | POA: Diagnosis not present

## 2022-03-28 DIAGNOSIS — M25551 Pain in right hip: Secondary | ICD-10-CM | POA: Diagnosis not present

## 2022-03-28 DIAGNOSIS — M25611 Stiffness of right shoulder, not elsewhere classified: Secondary | ICD-10-CM | POA: Diagnosis not present

## 2022-04-03 DIAGNOSIS — M47816 Spondylosis without myelopathy or radiculopathy, lumbar region: Secondary | ICD-10-CM | POA: Diagnosis not present

## 2022-04-03 DIAGNOSIS — N289 Disorder of kidney and ureter, unspecified: Secondary | ICD-10-CM | POA: Diagnosis not present

## 2022-04-03 DIAGNOSIS — N3289 Other specified disorders of bladder: Secondary | ICD-10-CM | POA: Diagnosis not present

## 2022-04-03 DIAGNOSIS — N390 Urinary tract infection, site not specified: Secondary | ICD-10-CM | POA: Diagnosis not present

## 2022-04-15 ENCOUNTER — Telehealth: Payer: Self-pay | Admitting: Pharmacist

## 2022-04-15 NOTE — Telephone Encounter (Signed)
Had 2 VM on machine from Friday from patient. Called pt. Went right to VM. Left VM.

## 2022-04-15 NOTE — Telephone Encounter (Signed)
Patient called back. States he was having swelling on Friday when he called. On Saturday he wasn't even able to walk his feet were swollen and hurt so bad. Turns out it was gout. Patient appreciative of the call back.

## 2022-05-28 ENCOUNTER — Telehealth: Payer: Self-pay | Admitting: Pharmacist

## 2022-05-28 MED ORDER — FUROSEMIDE 40 MG PO TABS: 20.0000 mg | ORAL_TABLET | Freq: Every day | ORAL | 0 refills | Status: DC | PRN

## 2022-05-28 NOTE — Telephone Encounter (Signed)
Patient called reporting significant swelling in his ankle and foot. It not painful. Denies any excess sodium intake. Slightly better in the AM. We discussed elevating feet and compression stocking. Patient does not want compression stockings. Will call in furosemide '40mg'$  prn. I have asked him to take 1/2 tablet ('20mg'$ ) initially. He can take 2 doses to see if this helps improve swelling. He is only to take as needed. I have asked patient to call me on Monday to re-assess and to determine if he needs updated labs.

## 2022-06-06 NOTE — Telephone Encounter (Signed)
Called pt and LVM to follow up on how his swelling is doing.

## 2022-06-06 NOTE — Telephone Encounter (Signed)
Spoke with patient who states he took 3 days of furosemide. Swelling is better. Just a little bit now. Has not taken any more furosemide. He will call us if he needs to use it more often.

## 2022-07-10 ENCOUNTER — Other Ambulatory Visit: Payer: Self-pay | Admitting: Interventional Cardiology

## 2022-07-12 ENCOUNTER — Other Ambulatory Visit: Payer: Self-pay | Admitting: Interventional Cardiology

## 2022-07-30 DIAGNOSIS — E1140 Type 2 diabetes mellitus with diabetic neuropathy, unspecified: Secondary | ICD-10-CM | POA: Diagnosis not present

## 2022-07-30 DIAGNOSIS — E782 Mixed hyperlipidemia: Secondary | ICD-10-CM | POA: Diagnosis not present

## 2022-08-05 DIAGNOSIS — N1832 Chronic kidney disease, stage 3b: Secondary | ICD-10-CM | POA: Diagnosis not present

## 2022-08-05 DIAGNOSIS — Z6831 Body mass index (BMI) 31.0-31.9, adult: Secondary | ICD-10-CM | POA: Diagnosis not present

## 2022-08-05 DIAGNOSIS — E782 Mixed hyperlipidemia: Secondary | ICD-10-CM | POA: Diagnosis not present

## 2022-08-05 DIAGNOSIS — Z1331 Encounter for screening for depression: Secondary | ICD-10-CM | POA: Diagnosis not present

## 2022-08-05 DIAGNOSIS — Z139 Encounter for screening, unspecified: Secondary | ICD-10-CM | POA: Diagnosis not present

## 2022-08-05 DIAGNOSIS — E1129 Type 2 diabetes mellitus with other diabetic kidney complication: Secondary | ICD-10-CM | POA: Diagnosis not present

## 2022-08-05 DIAGNOSIS — I10 Essential (primary) hypertension: Secondary | ICD-10-CM | POA: Diagnosis not present

## 2022-08-20 ENCOUNTER — Encounter (INDEPENDENT_AMBULATORY_CARE_PROVIDER_SITE_OTHER): Payer: Self-pay

## 2022-08-20 ENCOUNTER — Encounter (INDEPENDENT_AMBULATORY_CARE_PROVIDER_SITE_OTHER): Payer: Medicare Other | Admitting: Ophthalmology

## 2022-08-20 DIAGNOSIS — H353131 Nonexudative age-related macular degeneration, bilateral, early dry stage: Secondary | ICD-10-CM | POA: Diagnosis not present

## 2022-08-20 DIAGNOSIS — H2513 Age-related nuclear cataract, bilateral: Secondary | ICD-10-CM | POA: Diagnosis not present

## 2022-08-20 DIAGNOSIS — H43813 Vitreous degeneration, bilateral: Secondary | ICD-10-CM | POA: Diagnosis not present

## 2022-08-20 DIAGNOSIS — H33331 Multiple defects of retina without detachment, right eye: Secondary | ICD-10-CM | POA: Diagnosis not present

## 2022-09-12 DIAGNOSIS — G629 Polyneuropathy, unspecified: Secondary | ICD-10-CM | POA: Diagnosis not present

## 2022-09-24 DIAGNOSIS — E291 Testicular hypofunction: Secondary | ICD-10-CM | POA: Diagnosis not present

## 2022-09-24 DIAGNOSIS — N289 Disorder of kidney and ureter, unspecified: Secondary | ICD-10-CM | POA: Diagnosis not present

## 2022-09-24 DIAGNOSIS — N529 Male erectile dysfunction, unspecified: Secondary | ICD-10-CM | POA: Diagnosis not present

## 2022-09-24 DIAGNOSIS — C61 Malignant neoplasm of prostate: Secondary | ICD-10-CM | POA: Diagnosis not present

## 2022-10-07 ENCOUNTER — Telehealth: Payer: Self-pay | Admitting: Interventional Cardiology

## 2022-10-07 MED ORDER — ROSUVASTATIN CALCIUM 5 MG PO TABS: 5.0000 mg | ORAL_TABLET | Freq: Every day | ORAL | 3 refills | Status: DC

## 2022-10-07 NOTE — Telephone Encounter (Signed)
Pt c/o medication issue:  1. Name of Medication: rosuvastatin (CRESTOR) 5 MG tablet   2. How are you currently taking this medication (dosage and times per day)? She states pt is taking it once a day   3. Are you having a reaction (difficulty breathing--STAT)? No   4. What is your medication issue?  Pharmacy called in stating pt has been taking this once a day instead of MWF and they need an updated RX. Please advise.

## 2022-11-25 DIAGNOSIS — E1129 Type 2 diabetes mellitus with other diabetic kidney complication: Secondary | ICD-10-CM | POA: Diagnosis not present

## 2022-11-25 DIAGNOSIS — E782 Mixed hyperlipidemia: Secondary | ICD-10-CM | POA: Diagnosis not present

## 2022-12-02 DIAGNOSIS — Z1331 Encounter for screening for depression: Secondary | ICD-10-CM | POA: Diagnosis not present

## 2022-12-02 DIAGNOSIS — Z136 Encounter for screening for cardiovascular disorders: Secondary | ICD-10-CM | POA: Diagnosis not present

## 2022-12-02 DIAGNOSIS — Z0000 Encounter for general adult medical examination without abnormal findings: Secondary | ICD-10-CM | POA: Diagnosis not present

## 2022-12-02 DIAGNOSIS — Z139 Encounter for screening, unspecified: Secondary | ICD-10-CM | POA: Diagnosis not present

## 2022-12-02 DIAGNOSIS — Z1339 Encounter for screening examination for other mental health and behavioral disorders: Secondary | ICD-10-CM | POA: Diagnosis not present

## 2022-12-02 DIAGNOSIS — Z1389 Encounter for screening for other disorder: Secondary | ICD-10-CM | POA: Diagnosis not present

## 2022-12-02 DIAGNOSIS — I5189 Other ill-defined heart diseases: Secondary | ICD-10-CM | POA: Diagnosis not present

## 2022-12-02 DIAGNOSIS — E782 Mixed hyperlipidemia: Secondary | ICD-10-CM | POA: Diagnosis not present

## 2022-12-02 DIAGNOSIS — Z6830 Body mass index (BMI) 30.0-30.9, adult: Secondary | ICD-10-CM | POA: Diagnosis not present

## 2022-12-02 DIAGNOSIS — Z0189 Encounter for other specified special examinations: Secondary | ICD-10-CM | POA: Diagnosis not present

## 2022-12-02 DIAGNOSIS — E1129 Type 2 diabetes mellitus with other diabetic kidney complication: Secondary | ICD-10-CM | POA: Diagnosis not present

## 2022-12-02 DIAGNOSIS — N1832 Chronic kidney disease, stage 3b: Secondary | ICD-10-CM | POA: Diagnosis not present

## 2023-02-04 ENCOUNTER — Other Ambulatory Visit (INDEPENDENT_AMBULATORY_CARE_PROVIDER_SITE_OTHER): Payer: Medicare Other

## 2023-02-04 ENCOUNTER — Ambulatory Visit (INDEPENDENT_AMBULATORY_CARE_PROVIDER_SITE_OTHER): Payer: Medicare Other | Admitting: Orthopaedic Surgery

## 2023-02-04 DIAGNOSIS — Z96642 Presence of left artificial hip joint: Secondary | ICD-10-CM

## 2023-02-04 DIAGNOSIS — M25552 Pain in left hip: Secondary | ICD-10-CM

## 2023-02-04 MED ORDER — LIDOCAINE HCL 1 % IJ SOLN
3.0000 mL | INTRAMUSCULAR | Status: AC | PRN
  Administered 2023-02-04: 3 mL

## 2023-02-04 MED ORDER — METHYLPREDNISOLONE ACETATE 40 MG/ML IJ SUSP
40.0000 mg | INTRAMUSCULAR | Status: AC | PRN
  Administered 2023-02-04: 40 mg via INTRA_ARTICULAR

## 2023-02-04 NOTE — Progress Notes (Signed)
The patient is someone I am seeing for the first time.  This is for left hip pain.  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  The right hip was done by one of my colleagues in town with an anterior approach and that hip is doing great.  The left hip was done elsewhere many years ago.  The original bearing surface was metal-on-metal so that had to be changed out after elevated cobalt chrome levels.  In 2010 he underwent a revision of just the liner and hip ball.  Again this was done elsewhere into the posterior approach.  He continues to have pain and difficulty sleeping on that left side.  He has pain with standing for a long period of time as well.  He is a prediabetic.  He does have peripheral neuropathy as well.  When he walks he has a slight limp to his gait.  I can easily put both hips through internal and external [place] with no difficulty at all.  He is much more stiff on the left hip than the right hip.  When I have him lay supine his leg lengths are equal.  When I have him roll to the side his right hip has no pain but his left hip does have pain over the trochanteric area.  X-rays of the pelvis and both hips showed no complicating features of the hardware.  There is no cystic changes or other evidence to suggest implant failure or metallosis.  His maximum point of tenderness is over the trochanteric area.  I did recommend a steroid injection to try over this hip area to see if this will help his symptoms.  He agreed to this and tolerated it well.  He did let me know he had similar injections on his right side that resolved his problems on his right side.  He did tolerate steroid injection well over the left hip.  I like to see him back in 3 weeks and would consider an MRI on that left hip if he is not improving.  I did show him stretching exercises to try as well.    Procedure Note  Patient: Alan Snyder             Date of Birth: [birth year]           MRN: [id number]              Visit Date: 02/04/2023  Procedures: Visit Diagnoses:  1. History of left hip replacement   2. Pain of left hip     Large Joint Inj: L greater trochanter on 02/04/2023 1:46 PM Indications: pain and diagnostic evaluation Details: 22 G 1.5 in needle, lateral approach  Arthrogram: No  Medications: 3 mL lidocaine 1 %; 40 mg methylPREDNISolone acetate 40 MG/ML Outcome: tolerated well, no immediate complications Procedure, treatment alternatives, risks and benefits explained, specific risks discussed. Consent was given by the patient. Immediately prior to procedure a time out was called to verify the correct patient, procedure, equipment, support staff and site/side marked as required. Patient was prepped and draped in the usual sterile fashion.

## 2023-02-06 ENCOUNTER — Other Ambulatory Visit: Payer: Self-pay | Admitting: Interventional Cardiology

## 2023-02-11 DIAGNOSIS — M4316 Spondylolisthesis, lumbar region: Secondary | ICD-10-CM | POA: Diagnosis not present

## 2023-02-11 DIAGNOSIS — M5136 Other intervertebral disc degeneration, lumbar region: Secondary | ICD-10-CM | POA: Diagnosis not present

## 2023-02-11 DIAGNOSIS — M47817 Spondylosis without myelopathy or radiculopathy, lumbosacral region: Secondary | ICD-10-CM | POA: Diagnosis not present

## 2023-02-11 DIAGNOSIS — Z5181 Encounter for therapeutic drug level monitoring: Secondary | ICD-10-CM | POA: Diagnosis not present

## 2023-02-11 DIAGNOSIS — M47816 Spondylosis without myelopathy or radiculopathy, lumbar region: Secondary | ICD-10-CM | POA: Diagnosis not present

## 2023-02-11 DIAGNOSIS — I739 Peripheral vascular disease, unspecified: Secondary | ICD-10-CM | POA: Diagnosis not present

## 2023-02-11 DIAGNOSIS — Z79899 Other long term (current) drug therapy: Secondary | ICD-10-CM | POA: Diagnosis not present

## 2023-02-18 NOTE — Progress Notes (Unsigned)
Cardiology Office Note:   Date:  02/19/2023  ID:  Alan Snyder, DOB 12/27/1945, MRN [id number] PCP:  Charlott Rakes, MD  [facility] Providers Cardiologist:  Alverda Skeans, MD Referring MD: Charlott Rakes, MD   Chief Complaint/Reason for Referral: Cardiology follow-up; hypertension ASSESSMENT:    1. Primary hypertension   2. Dyslipidemia   3. Stage 3a chronic kidney disease (HCC)   4. BMI 31.0-31.9,adult   5. Type 2 diabetes mellitus with complication, without long-term current use of insulin (HCC)     PLAN:   In order of problems listed above: 1.  Hypertension: Will obtain echocardiogram to evaluate.  Blood pressure is well-controlled on his current regimen today. 2.  Hyperlipidemia: LDL done outside of our system in June 2024 was 55 with normal ALT. 3.  Chronic kidney disease stage III: Continue valsartan and Farxiga for renal protection.  Aggressive blood pressure control with goal less than 130/80 mmHg. 4.  Elevated BMI: Refer to pharmacy for recommendations regarding GLP-1 receptor agonism. 5.  T2DM:  Start ASA 81mg , continue valsartan, continue Crestor, and continue [place].            Dispo:  Return in about 6 months (around 08/22/2023).      Medication Adjustments/Labs and Tests Ordered: Current medicines are reviewed at length with the patient today.  Concerns regarding medicines are outlined above.  The following changes have been made:     Labs/tests ordered: Orders Placed This Encounter  Procedures   AMB Referral to [facility]   EKG 12-Lead   ECHOCARDIOGRAM COMPLETE    Medication Changes: Meds ordered this encounter  Medications   aspirin EC 81 MG tablet    Sig: Take 1 tablet (81 mg total) by mouth daily. Swallow whole.    Dispense:  30 tablet    Refill:  12   dapagliflozin propanediol (FARXIGA) 10 MG TABS tablet    Sig: Take 1 tablet (10 mg total) by mouth daily before breakfast.    Current medicines are reviewed at length with  the patient today.  The patient does not have concerns regarding medicines.  History of Present Illness:   FOCUSED PROBLEM LIST:   Hypertension Hyperlipidemia Idiopathic peripheral neuropathy CKD stage III BMI 31 Type 2 diabetes mellitus not on insulin  The patient is a 77 y.o. male with the indicated medical history here for routine cardiology follow-up.  The patient was last seen by Dr. Eldridge Dace  and July 2023.  At that point in time his blood pressure was not at goal.  He had reported some peripheral edema that seem to be responding to Lasix.  It had been thought to be related to amlodipine usage in the past.  Amlodipine 5 mg was added back.  He was referred to the pharmacy team but unfortunately he did not see them.  It looks like his amlodipine was stopped in the interim.  The patient is well.  His primary issues regard low back pain and hip pain and this has limited his ability to lose weight.  He denies any exertional angina or dyspnea.  He did feel lightheaded 1 day when he was sitting down.  This is associated with nausea.  He had some apple juice and this resolved.  He said no positional presyncope when going from sitting to standing.  He denies any signs or symptoms of stroke.  He has had no severe bleeding.  He has been tolerating his rosuvastatin well without significant myalgias or arthralgias.  Current Medications: Current Meds  Medication Sig   aspirin EC 81 MG tablet Take 1 tablet (81 mg total) by mouth daily. Swallow whole.   dapagliflozin propanediol (FARXIGA) 10 MG TABS tablet Take 1 tablet (10 mg total) by mouth daily before breakfast.   docusate sodium (COLACE) 100 MG capsule Take 1 capsule (100 mg total) by mouth 2 (two) times daily.   ezetimibe (ZETIA) 10 MG tablet Take 10 mg by mouth daily.   fenofibrate (TRICOR) 145 MG tablet Take 145 mg by mouth daily.   gabapentin (NEURONTIN) 300 MG capsule Take 600-900 mg by mouth See admin instructions. Take 600 mg by mouth  in the morning and 900 mg at night   methocarbamol (ROBAXIN) 500 MG tablet Take 1 tablet (500 mg total) by mouth every 6 (six) hours as needed for muscle spasms.   Multiple Vitamin (MULTIVITAMIN WITH MINERALS) TABS tablet Take 1 tablet by mouth daily.   nebivolol (BYSTOLIC) 10 MG tablet Take 10 mg by mouth daily.   Omega-3 Fatty Acids (FISH OIL) 1000 MG CAPS Take 2,000 mg by mouth daily at 6 (six) AM.   rosuvastatin (CRESTOR) 5 MG tablet Take 1 tablet (5 mg total) by mouth daily.   spironolactone (ALDACTONE) 25 MG tablet Take 1 tablet (25 mg total) by mouth daily. Please call office to schedule an appt for further refills. Thank you   traZODone (DESYREL) 50 MG tablet Take 50-150 mg by mouth at bedtime.   valsartan (DIOVAN) 320 MG tablet Take 320 mg by mouth daily.   vitamin B-12 (CYANOCOBALAMIN) 1000 MCG tablet Take 1 tablet by mouth daily.     Allergies:    Morphine and codeine   Social History:   Social History   Tobacco Use   Smoking status: Former    Types: Cigarettes   Smokeless tobacco: Never  Vaping Use   Vaping status: Never Used  Substance Use Topics   Alcohol use: Yes    Comment: OCCASIONAL   Drug use: Never     Family Hx: Family History  Problem Relation Age of Onset   Heart failure Mother      Review of Systems:   Please see the history of present illness.    All other systems reviewed and are negative.     EKGs/Labs/Other Test Reviewed:   EKG: EKG that I reviewed personally performed today demonstrates sinus rhythm and inferior infarction pattern  EKG Interpretation Date/Time:  Wednesday February 19 2023 13:54:07 EDT Ventricular Rate:  68 PR Interval:  182 QRS Duration:  90 QT Interval:  410 QTC Calculation: 435 R Axis:   -39  Text Interpretation: Normal sinus rhythm Left axis deviation Inferior infarct , age undetermined When compared with ECG of 17-Apr-2021 10:38, No significant change was found Confirmed by Alverda Skeans ([id number]) on 02/19/2023 2:14:37  PM        Prior CV studies reviewed:     Recent Labs: 03/20/2022: BUN 35; Creatinine, Ser 1.64; Potassium 4.7; Sodium 139   Lipid Panel No results found for: "CHOL", "TRIG", "HDL", "CHOLHDL", "VLDL", "LDLCALC", "LDLDIRECT"  Risk Assessment/Calculations:          Physical Exam:   VS:  BP 120/70 (BP Location: Left Arm, Patient Position: Sitting, Cuff Size: Normal)   Ht 6' (1.829 m)   Wt 220 lb (99.8 kg)   BMI 29.84 kg/m        Wt Readings from Last 3 Encounters:  02/19/23 220 lb (99.8 kg)  12/27/21 233 lb (105.7 kg)  04/26/21 227 lb (103 kg)      GENERAL:  No apparent distress, AOx3 HEENT:  No carotid bruits, +2 carotid impulses, no scleral icterus CAR: RRR no murmurs, gallops, rubs, or thrills RES:  Clear to auscultation bilaterally ABD:  Soft, nontender, nondistended, positive bowel sounds x 4 VASC:  +2 radial pulses, +2 carotid pulses NEURO:  CN 2-12 grossly intact; motor and sensory grossly intact PSYCH:  No active depression or anxiety EXT:  No edema, ecchymosis, or cyanosis  Signed, Orbie Pyo, MD  02/19/2023 2:52 PM    [facility] [address] [place], [place], [place]  [address] Phone: [phone]; Fax: [phone]   Note:  This document was prepared using Dragon voice recognition software and may include unintentional dictation errors.

## 2023-02-19 ENCOUNTER — Ambulatory Visit
Admission: RE | Admit: 2023-02-19 | Discharge: 2023-02-19 | Disposition: A | Discharge status: Home / Self Care | Payer: Medicare Other | Source: Ambulatory Visit | Attending: Cardiology | Admitting: Cardiology

## 2023-02-19 ENCOUNTER — Ambulatory Visit (INDEPENDENT_AMBULATORY_CARE_PROVIDER_SITE_OTHER): Payer: Medicare Other | Admitting: Internal Medicine

## 2023-02-19 ENCOUNTER — Encounter: Payer: Self-pay | Admitting: Internal Medicine

## 2023-02-19 ENCOUNTER — Other Ambulatory Visit (HOSPITAL_COMMUNITY): Payer: Self-pay | Admitting: Anesthesiology

## 2023-02-19 VITALS — BP 120/70 | Ht 72.0 in | Wt 220.0 lb

## 2023-02-19 DIAGNOSIS — I739 Peripheral vascular disease, unspecified: Secondary | ICD-10-CM | POA: Diagnosis not present

## 2023-02-19 DIAGNOSIS — Z6831 Body mass index (BMI) 31.0-31.9, adult: Secondary | ICD-10-CM | POA: Insufficient documentation

## 2023-02-19 DIAGNOSIS — N1831 Chronic kidney disease, stage 3a: Secondary | ICD-10-CM

## 2023-02-19 DIAGNOSIS — I10 Essential (primary) hypertension: Secondary | ICD-10-CM

## 2023-02-19 DIAGNOSIS — E118 Type 2 diabetes mellitus with unspecified complications: Secondary | ICD-10-CM | POA: Diagnosis not present

## 2023-02-19 DIAGNOSIS — E785 Hyperlipidemia, unspecified: Secondary | ICD-10-CM

## 2023-02-19 MED ORDER — DAPAGLIFLOZIN PROPANEDIOL 10 MG PO TABS: 10.0000 mg | ORAL_TABLET | Freq: Every day | ORAL | Status: AC

## 2023-02-19 MED ORDER — ASPIRIN 81 MG PO TBEC: 81.0000 mg | DELAYED_RELEASE_TABLET | Freq: Every day | ORAL | 12 refills | Status: AC

## 2023-02-19 NOTE — Patient Instructions (Signed)
Medication Instructions:  Your physician has recommended you make the following change in your medication:   Start taking aspirin 81 mg daily  *If you need a refill on your cardiac medications before your next appointment, please call your pharmacy*   Testing/Procedures: Your physician has requested that you have an echocardiogram. Echocardiography is a painless test that uses sound waves to create images of your heart. It provides your doctor with information about the size and shape of your heart and how well your heart's chambers and valves are working. This procedure takes approximately one hour. There are no restrictions for this procedure. Please do NOT wear cologne, perfume, aftershave, or lotions (deodorant is allowed). Please arrive 15 minutes prior to your appointment time.    Follow-Up: At [facility], you and your health needs are our priority.  As part of our continuing mission to provide you with exceptional heart care, we have created designated Provider Care Teams.  These Care Teams include your primary Cardiologist (physician) and Advanced Practice Providers (APPs -  Physician Assistants and Nurse Practitioners) who all work together to provide you with the care you need, when you need it.   Your next appointment:   6 month(s)  Provider:   Dr Lynnette Caffey

## 2023-02-20 ENCOUNTER — Ambulatory Visit: Payer: Medicare Other | Attending: Cardiovascular Disease | Admitting: Pharmacist

## 2023-02-20 DIAGNOSIS — E669 Obesity, unspecified: Secondary | ICD-10-CM | POA: Insufficient documentation

## 2023-02-20 DIAGNOSIS — Z87891 Personal history of nicotine dependence: Secondary | ICD-10-CM | POA: Insufficient documentation

## 2023-02-20 DIAGNOSIS — R7303 Prediabetes: Secondary | ICD-10-CM | POA: Insufficient documentation

## 2023-02-20 DIAGNOSIS — I129 Hypertensive chronic kidney disease with stage 1 through stage 4 chronic kidney disease, or unspecified chronic kidney disease: Secondary | ICD-10-CM | POA: Diagnosis not present

## 2023-02-20 DIAGNOSIS — E785 Hyperlipidemia, unspecified: Secondary | ICD-10-CM | POA: Insufficient documentation

## 2023-02-20 DIAGNOSIS — N1831 Chronic kidney disease, stage 3a: Secondary | ICD-10-CM | POA: Insufficient documentation

## 2023-02-20 DIAGNOSIS — Z6831 Body mass index (BMI) 31.0-31.9, adult: Secondary | ICD-10-CM

## 2023-02-20 LAB — VAS US ABI WITH/WO TBI
Left ABI: 1.1
Right ABI: 1.08

## 2023-02-20 MED ORDER — ZEPBOUND 2.5 MG/0.5ML ~~LOC~~ SOAJ: 2.5000 mg | SUBCUTANEOUS | 0 refills | Status: DC

## 2023-02-20 NOTE — Patient Instructions (Signed)

## 2023-02-20 NOTE — Progress Notes (Signed)
Patient ID: Alan Snyder                 DOB: 06/24/1946                    MRN: [id number]      HPI: Alan Snyder is a 77 y.o. male patient referred to pharmacy clinic by Dr. Lynnette Caffey to initiate weight loss therapy with GLP1-RA. PMH is significant for obesity complicated by chronic medical conditions including HTN,. HLD, CKD stage 3a, prediabetes. Most recent BMI 29.84.  Pt reports to clinic today. Discussed indications for GLP-1s. He has T2DM as a diagnosis on his medical record, however pt states the highest his A1c has been was 6.4% which indicates prediabetes. Reports taking liquid IV and asks if this is okay given he is on spironolactone. He is very active- goes to gym 3 days per week and swims 3 days per week.   Current weight management medications: none  Previously tried meds: none  Current meds that may affect weight: none  Baseline weight/BMI: 29.84  Insurance payor: Medicare with BCBS supplement  Diet:  2 meals/day -Lunch: burger [employer] fish sandwich, chicken salad, cottage cheese  -Dinner: low carb, meat, and veggies, avoids rice and potatoes -Snacks: goldfish -Drinks: rarely alcohol, mainly water 60 oz/day, liquid IV  Exercise: goes to gym 3 days per week, swim 3 days per week  Family History: mother- heart failure  Social History: former tobacco use, current alcohol use  Labs: Lab Results  Component Value Date   HGBA1C 5.4 04/06/2019    Wt Readings from Last 1 Encounters:  02/19/23 220 lb (99.8 kg)    BP Readings from Last 1 Encounters:  02/19/23 120/70   Pulse Readings from Last 1 Encounters:  02/14/22 61    No results found for: "CHOL", "TRIG", "HDL", "CHOLHDL", "VLDL", "LDLCALC", "LDLDIRECT"  Past Medical History:  Diagnosis Date   Arthritis    Cancer (HCC)    PROSTATE CANCER   Chronic pain    Diastolic dysfunction    Edema    Elevated PSA    Hypertension    Mixed hyperlipidemia    PONV (postoperative nausea and vomiting)     Current  Outpatient Medications on File Prior to Visit  Medication Sig Dispense Refill   aspirin EC 81 MG tablet Take 1 tablet (81 mg total) by mouth daily. Swallow whole. 30 tablet 12   dapagliflozin propanediol (FARXIGA) 10 MG TABS tablet Take 1 tablet (10 mg total) by mouth daily before breakfast.     docusate sodium (COLACE) 100 MG capsule Take 1 capsule (100 mg total) by mouth 2 (two) times daily. 10 capsule 0   ezetimibe (ZETIA) 10 MG tablet Take 10 mg by mouth daily.     fenofibrate (TRICOR) 145 MG tablet Take 145 mg by mouth daily.     gabapentin (NEURONTIN) 300 MG capsule Take 600-900 mg by mouth See admin instructions. Take 600 mg by mouth in the morning and 900 mg at night     methocarbamol (ROBAXIN) 500 MG tablet Take 1 tablet (500 mg total) by mouth every 6 (six) hours as needed for muscle spasms. 40 tablet 0   Multiple Vitamin (MULTIVITAMIN WITH MINERALS) TABS tablet Take 1 tablet by mouth daily.     nebivolol (BYSTOLIC) 10 MG tablet Take 10 mg by mouth daily.     Omega-3 Fatty Acids (FISH OIL) 1000 MG CAPS Take 2,000 mg by mouth daily at 6 (six) AM.  rosuvastatin (CRESTOR) 5 MG tablet Take 1 tablet (5 mg total) by mouth daily. 90 tablet 3   spironolactone (ALDACTONE) 25 MG tablet Take 1 tablet (25 mg total) by mouth daily. Please call office to schedule an appt for further refills. Thank you 30 tablet 0   traZODone (DESYREL) 50 MG tablet Take 50-150 mg by mouth at bedtime.     valsartan (DIOVAN) 320 MG tablet Take 320 mg by mouth daily.     vitamin B-12 (CYANOCOBALAMIN) 1000 MCG tablet Take 1 tablet by mouth daily.     No current facility-administered medications on file prior to visit.    Allergies  Allergen Reactions   Morphine And Codeine Itching     Assessment/Plan:  1. Weight loss - Patient has a diagnosis of T2DM on his medical record, however cannot find an A1c of at least 6.5% and pt reports highest A1c he has had was 6.4% so insurance would not cover GLP-1 for diabetes  indications. GLP-1s for weight loss indications are not covered by Medicare. Discussed [employer] [facility] cash pay program that offers Zepbound 2.5 mg and 5 mg single dose vials and he would like to try it. Will start Zepbound 2.5 mg weekly. Confirmed patient does not have a hx of pancreatitis and no personal or family history of medullary thyroid carcinoma (MTC) or Multiple Endocrine Neoplasia syndrome type 2 (MEN 2).   Advised patient on common side effects including nausea, diarrhea, dyspepsia, decreased appetite, and fatigue. Counseled patient on reducing meal size and how to titrate medication to minimize side effects. Counseled patient to call if intolerable side effects or if experiencing dehydration, abdominal pain, or dizziness. Patient will adhere to dietary modifications and will target at least 150 minutes of moderate intensity exercise weekly.   Advised him to stop taking liquid IV given this has potassium in it and he takes spironolactone. K also is normally on the higher side of normal.  Follow up in 4 weeks.  Adam Phenix, PharmD PGY-1 Pharmacy Resident  Olene Floss, Pharm.D, BCACP, BCPS, CPP [facility] [address] [place], [place] [address]  Phone: [phone]; Fax: [phone]

## 2023-02-25 ENCOUNTER — Ambulatory Visit (HOSPITAL_COMMUNITY): Payer: Medicare Other | Attending: Internal Medicine

## 2023-02-25 DIAGNOSIS — N1831 Chronic kidney disease, stage 3a: Secondary | ICD-10-CM | POA: Diagnosis not present

## 2023-02-25 DIAGNOSIS — E785 Hyperlipidemia, unspecified: Secondary | ICD-10-CM | POA: Diagnosis not present

## 2023-02-25 DIAGNOSIS — E118 Type 2 diabetes mellitus with unspecified complications: Secondary | ICD-10-CM | POA: Insufficient documentation

## 2023-02-25 DIAGNOSIS — Z6831 Body mass index (BMI) 31.0-31.9, adult: Secondary | ICD-10-CM | POA: Insufficient documentation

## 2023-02-25 DIAGNOSIS — I10 Essential (primary) hypertension: Secondary | ICD-10-CM | POA: Insufficient documentation

## 2023-02-25 LAB — ECHOCARDIOGRAM COMPLETE
Area-P 1/2: 2.12 cm2
S' Lateral: 2.9 cm

## 2023-02-25 MED ORDER — PERFLUTREN LIPID MICROSPHERE
3.0000 mL | INTRAVENOUS | Status: AC | PRN
Start: 2023-02-25 — End: 2023-02-25
  Administered 2023-02-25: 3 mL via INTRAVENOUS

## 2023-02-27 ENCOUNTER — Encounter: Payer: Self-pay | Admitting: Orthopaedic Surgery

## 2023-02-27 ENCOUNTER — Ambulatory Visit (INDEPENDENT_AMBULATORY_CARE_PROVIDER_SITE_OTHER): Payer: Self-pay | Admitting: Orthopaedic Surgery

## 2023-02-27 DIAGNOSIS — Z96642 Presence of left artificial hip joint: Secondary | ICD-10-CM

## 2023-02-27 DIAGNOSIS — M25552 Pain in left hip: Secondary | ICD-10-CM

## 2023-02-27 NOTE — Progress Notes (Signed)
The patient is an active 77 year old gentleman who comes in for follow-up after I injected the left hip trochanteric area about 3 weeks ago.  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  He was having some chronic hip tendinitis and bursitis and a steroid injection over that area he said is helped him significantly as well as stretching exercises.  He says some of the pain is coming back.  This may be spine related as well and he is seeing a spine specialist.  Overall though he is satisfied.  He is not walking with assist device.  He does have a little bit of pain over the trochanteric area but it is just mild.  Again he says he can sleep on that side at night.  He understands that we can always repeat an injection in about 3 months from now if he needs it.  All question concerns were answered addressed.  Follow-up otherwise is as needed.

## 2023-02-28 ENCOUNTER — Other Ambulatory Visit: Payer: Self-pay | Admitting: Pharmacist

## 2023-02-28 MED ORDER — TIRZEPATIDE-WEIGHT MANAGEMENT 2.5 MG/0.5ML ~~LOC~~ SOLN: 2.5000 mg | SUBCUTANEOUS | 0 refills | Status: DC

## 2023-03-07 ENCOUNTER — Telehealth: Payer: Self-pay | Admitting: *Deleted

## 2023-03-07 DIAGNOSIS — I77819 Aortic ectasia, unspecified site: Secondary | ICD-10-CM

## 2023-03-07 NOTE — Telephone Encounter (Signed)
CTA chest to evaluate aortic dilatation in 6 months .  Left VM and sent message thought the portal of recommendations.  Order placed for CTA and bmet for March 2025.

## 2023-03-12 DIAGNOSIS — Z23 Encounter for immunization: Secondary | ICD-10-CM | POA: Diagnosis not present

## 2023-03-17 ENCOUNTER — Telehealth: Payer: Self-pay | Admitting: Pharmacist

## 2023-03-17 DIAGNOSIS — E6609 Other obesity due to excess calories: Secondary | ICD-10-CM

## 2023-03-17 DIAGNOSIS — Z6831 Body mass index (BMI) 31.0-31.9, adult: Secondary | ICD-10-CM

## 2023-03-17 MED ORDER — TIRZEPATIDE-WEIGHT MANAGEMENT 2.5 MG/0.5ML ~~LOC~~ SOLN: 2.5000 mg | SUBCUTANEOUS | 3 refills | Status: DC

## 2023-03-17 NOTE — Telephone Encounter (Signed)
Patient called reporting he has 1 more vial of Zepbound left.  [place] is leaving the country today and will be back next Saturday.  He needs a refill.  He is doing great losing weight appetite is suppressed.  He would like to stay on the 2.5 dose at least a little longer.  I will send a new prescription to [facility] with a few refills.  Patient will call me if he decides that he wants to go up to 5 mg.

## 2023-03-17 NOTE — Addendum Note (Signed)
Addended by: Malena Peer D on: 03/17/2023 01:33 PM   Modules accepted: Orders

## 2023-03-24 ENCOUNTER — Other Ambulatory Visit: Payer: Self-pay | Admitting: Interventional Cardiology

## 2023-04-01 DIAGNOSIS — N289 Disorder of kidney and ureter, unspecified: Secondary | ICD-10-CM | POA: Diagnosis not present

## 2023-04-01 DIAGNOSIS — C61 Malignant neoplasm of prostate: Secondary | ICD-10-CM | POA: Diagnosis not present

## 2023-04-01 DIAGNOSIS — E291 Testicular hypofunction: Secondary | ICD-10-CM | POA: Diagnosis not present

## 2023-04-07 DIAGNOSIS — E1129 Type 2 diabetes mellitus with other diabetic kidney complication: Secondary | ICD-10-CM | POA: Diagnosis not present

## 2023-04-07 DIAGNOSIS — N1832 Chronic kidney disease, stage 3b: Secondary | ICD-10-CM | POA: Diagnosis not present

## 2023-04-07 DIAGNOSIS — Z6828 Body mass index (BMI) 28.0-28.9, adult: Secondary | ICD-10-CM | POA: Diagnosis not present

## 2023-04-10 MED ORDER — TIRZEPATIDE-WEIGHT MANAGEMENT 5 MG/0.5ML ~~LOC~~ SOLN: 5.0000 mg | SUBCUTANEOUS | 4 refills | Status: DC

## 2023-04-10 NOTE — Addendum Note (Signed)
Addended by: Malena Peer D on: 04/10/2023 10:07 AM   Modules accepted: Orders

## 2023-04-10 NOTE — Telephone Encounter (Signed)
Patient starting his 3rd month of Zepbound 2.5mg . Doing well. No s/x. Eating less, keeping meals small, no late night snacks or fish sandwiches. Would like to increase to 5mg . Rx sent to [facility] cash pay

## 2023-04-16 ENCOUNTER — Ambulatory Visit: Payer: Medicare Other | Admitting: Internal Medicine

## 2023-04-24 ENCOUNTER — Other Ambulatory Visit: Payer: Self-pay | Admitting: Pharmacist

## 2023-04-24 MED ORDER — TIRZEPATIDE-WEIGHT MANAGEMENT 5 MG/0.5ML ~~LOC~~ SOLN: 5.0000 mg | SUBCUTANEOUS | 4 refills | Status: DC

## 2023-04-24 NOTE — Telephone Encounter (Signed)
Patient called. Reports he has not received new Zepbound vial from [facility]. Checked and it was sent on 10/17. Sent Rx again.

## 2023-05-07 DIAGNOSIS — N1832 Chronic kidney disease, stage 3b: Secondary | ICD-10-CM | POA: Diagnosis not present

## 2023-05-12 DIAGNOSIS — Z6828 Body mass index (BMI) 28.0-28.9, adult: Secondary | ICD-10-CM | POA: Diagnosis not present

## 2023-05-12 DIAGNOSIS — N1832 Chronic kidney disease, stage 3b: Secondary | ICD-10-CM | POA: Diagnosis not present

## 2023-05-24 IMAGING — DX DG PORTABLE PELVIS
1 series · 1 of 1 positions shown · non-contrast
Comparison: Hip radiograph 04/26/2021

CLINICAL DATA: Status post right total hip replacement

EXAM:
PORTABLE PELVIS 1-2 VIEWS

[pelvis ap]
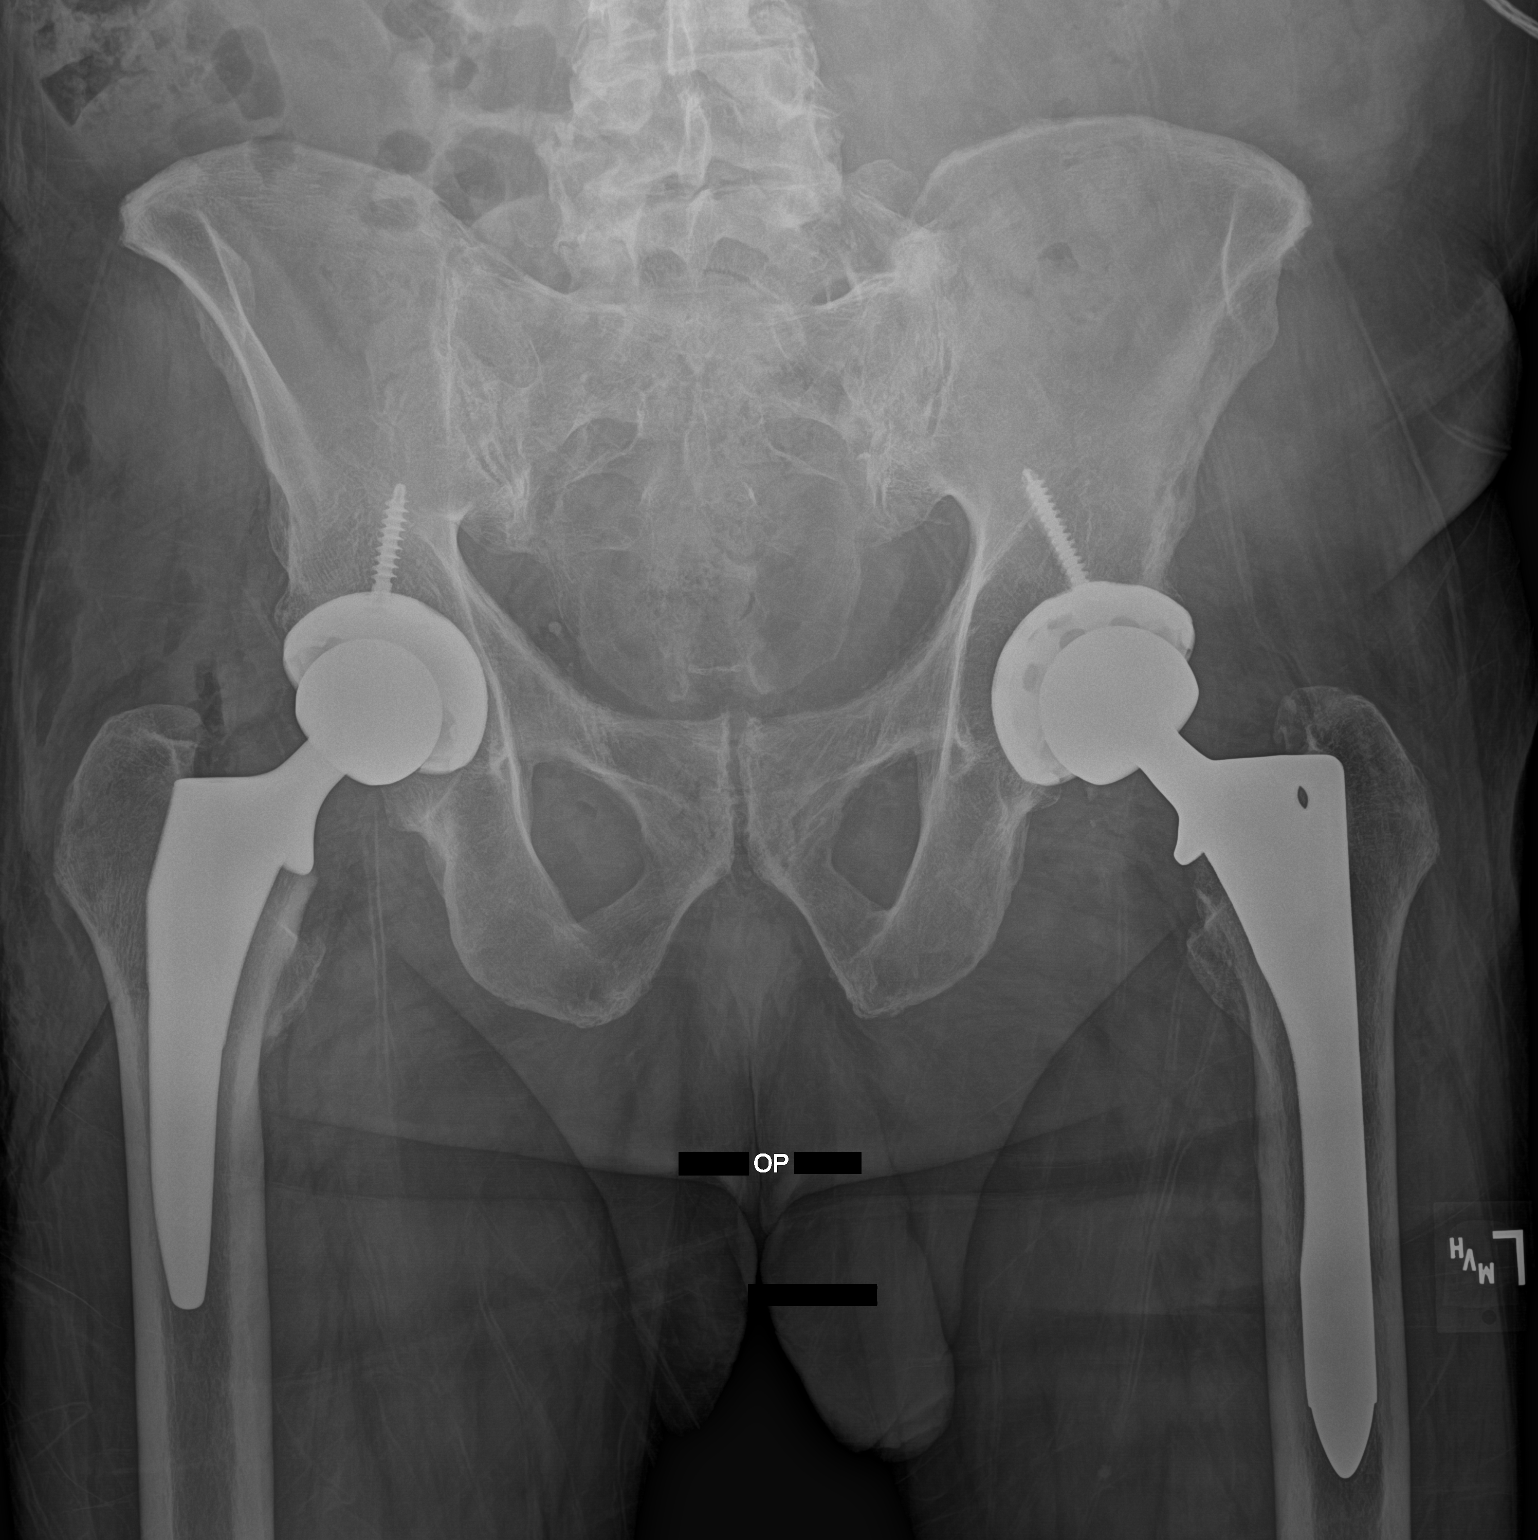

[1 of 1 positions shown; findings below may reference images not displayed]

FINDINGS: There is a right total hip arthroplasty in normal alignment without
evidence of loosening or periprosthetic fracture. Expected soft
tissue changes. Normal aligned left total hip arthroplasty also
without evidence of complication. Mild changes of osteitis pubis.
Mild lower lumbar spine and bilateral SI joint degenerative
IMPRESSION: Right total hip arthroplasty in normal alignment without evidence of
immediate hardware complication on single frontal view.

## 2023-05-24 IMAGING — RF DG HIP (WITH PELVIS) OPERATIVE*R*
1 series · 4 of 4 positions shown · non-contrast
Comparison: None.

FLUOROSCOPY TIME:  [DATE]

CLINICAL DATA: Right hip replacement

EXAM:
OPERATIVE RIGHT HIP (WITH PELVIS IF PERFORMED)  VIEWS
TECHNIQUE: Fluoroscopic spot image(s) were submitted for interpretation
post-operatively.

[Series 1: unknown protocol · 0.20mm/px · 4 of 4 slices shown]
[im 1/4]
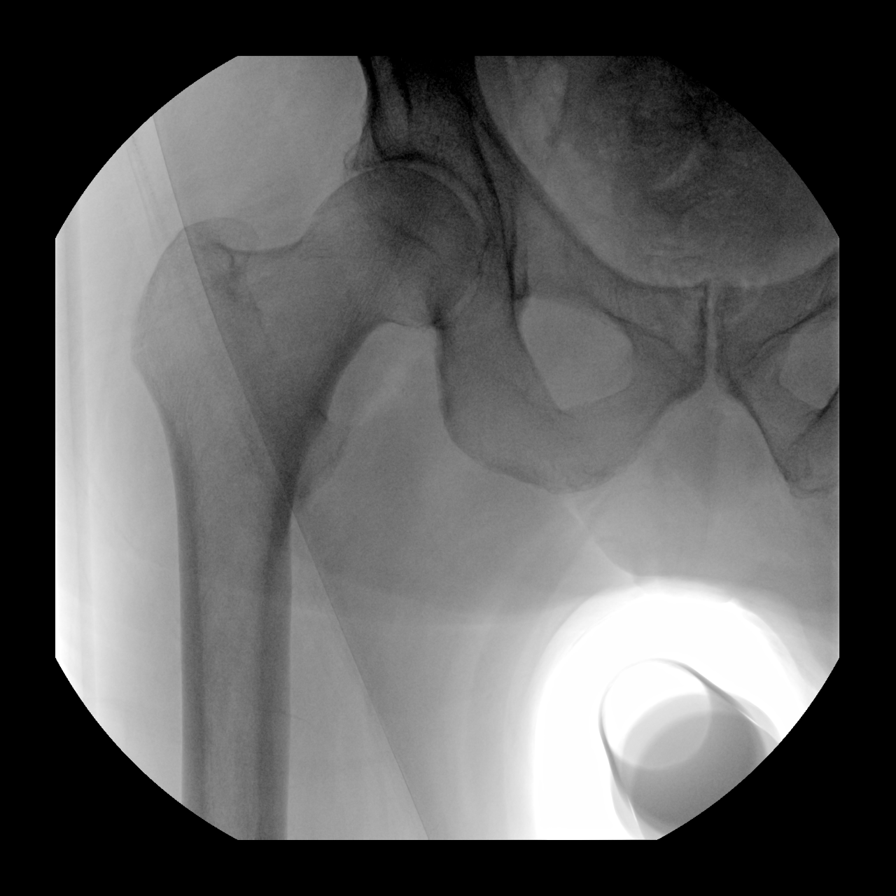
[im 2/4]
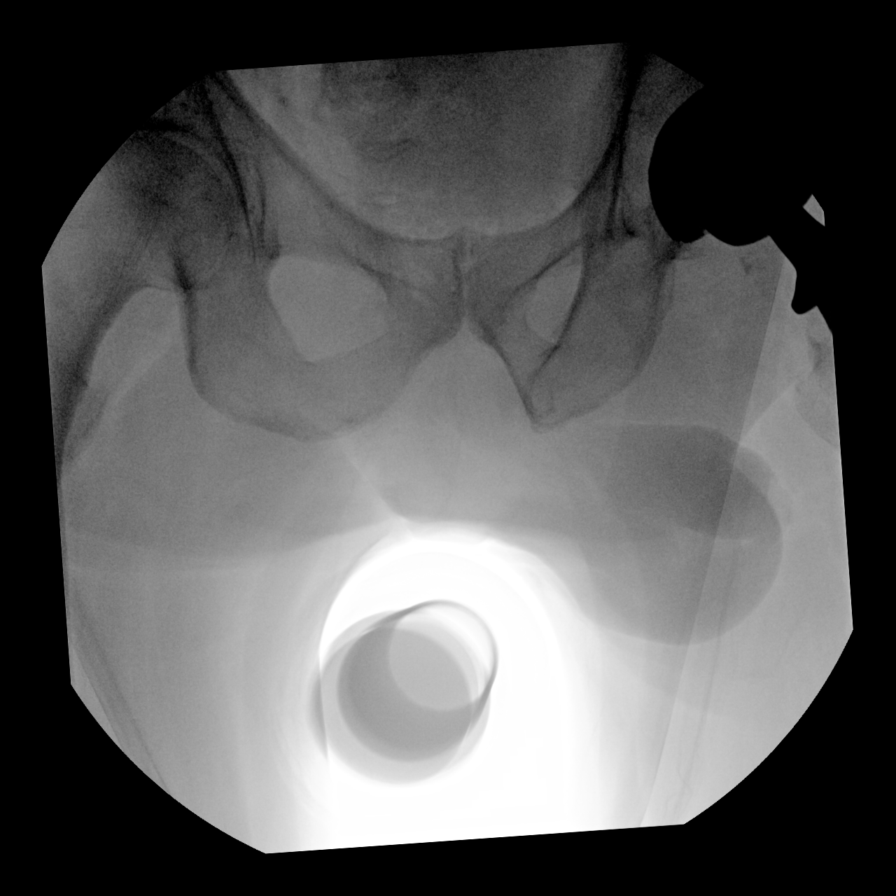
[im 3/4]
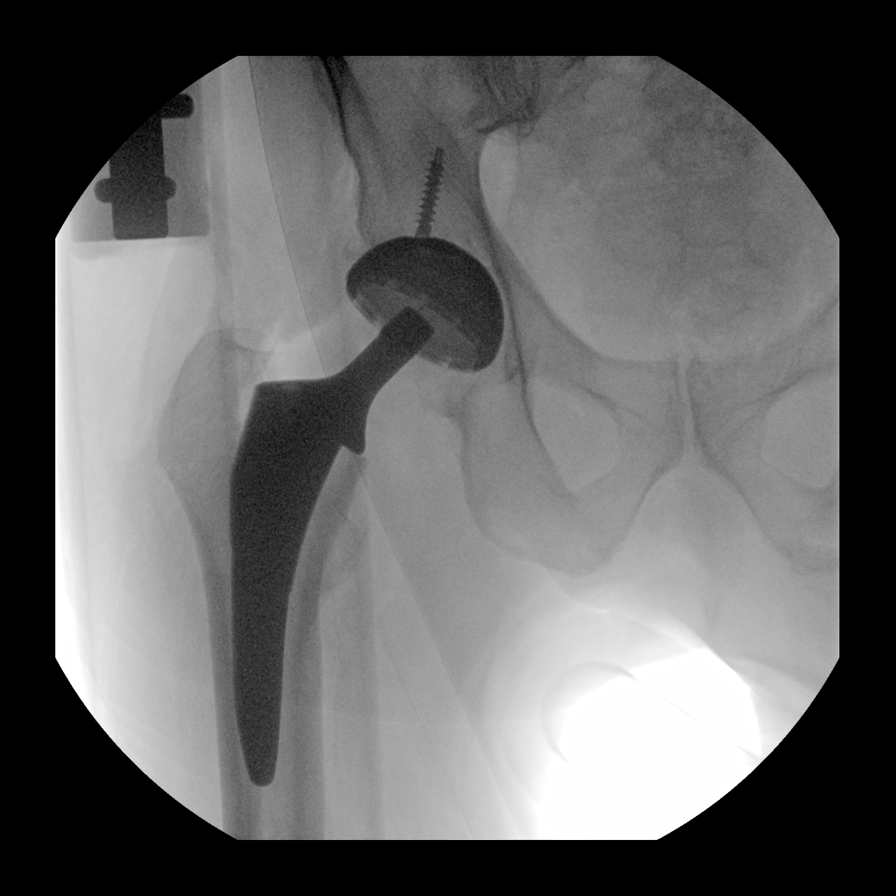
[im 4/4]
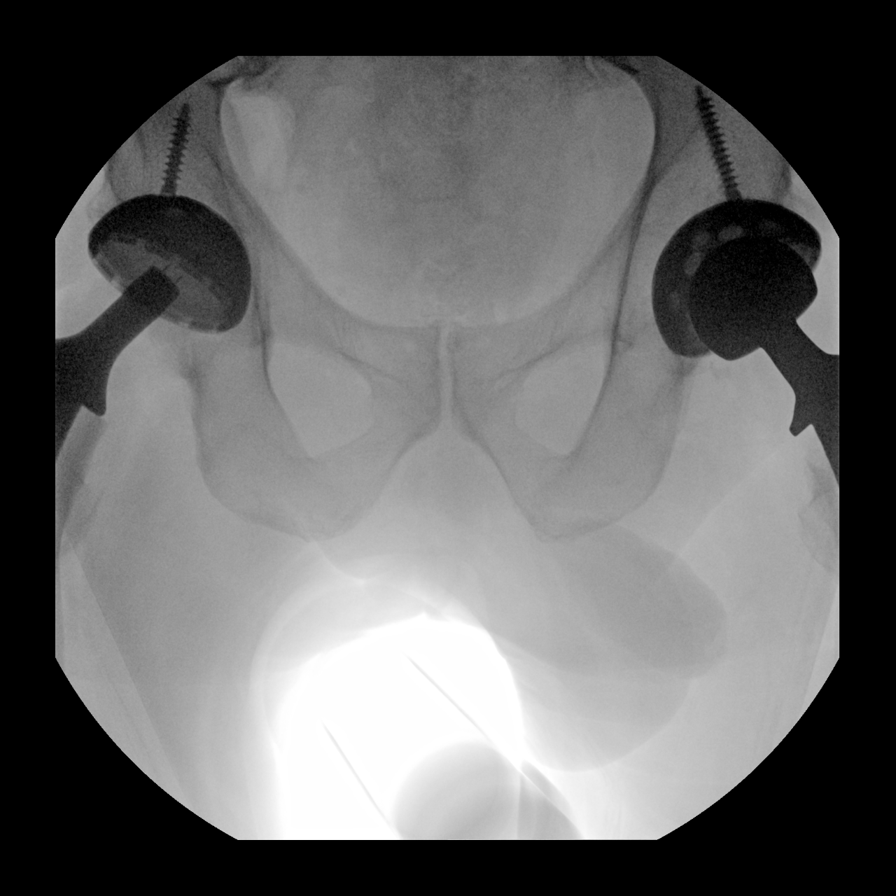

[4 of 4 positions shown; findings below may reference images not displayed]

FINDINGS: Intraoperative fluoroscopic images of the right hip demonstrate
right hip total arthroplasty. No evidence of perihardware fracture
or component malpositioning.
IMPRESSION: Intraoperative fluoroscopic images of the right hip demonstrate
right hip total arthroplasty. No evidence of perihardware fracture
or component malpositioning.

## 2023-06-03 ENCOUNTER — Other Ambulatory Visit: Payer: Self-pay | Admitting: Pharmacist

## 2023-06-03 MED ORDER — TIRZEPATIDE-WEIGHT MANAGEMENT 5 MG/0.5ML ~~LOC~~ SOLN: 5.0000 mg | SUBCUTANEOUS | 4 refills | Status: DC

## 2023-07-07 ENCOUNTER — Ambulatory Visit: Payer: BLUE CROSS/BLUE SHIELD | Admitting: Orthopaedic Surgery

## 2023-08-06 DIAGNOSIS — E1129 Type 2 diabetes mellitus with other diabetic kidney complication: Secondary | ICD-10-CM | POA: Diagnosis not present

## 2023-08-06 DIAGNOSIS — E782 Mixed hyperlipidemia: Secondary | ICD-10-CM | POA: Diagnosis not present

## 2023-08-11 DIAGNOSIS — E782 Mixed hyperlipidemia: Secondary | ICD-10-CM | POA: Diagnosis not present

## 2023-08-11 DIAGNOSIS — I10 Essential (primary) hypertension: Secondary | ICD-10-CM | POA: Diagnosis not present

## 2023-08-11 DIAGNOSIS — Z1331 Encounter for screening for depression: Secondary | ICD-10-CM | POA: Diagnosis not present

## 2023-08-11 DIAGNOSIS — N1832 Chronic kidney disease, stage 3b: Secondary | ICD-10-CM | POA: Diagnosis not present

## 2023-08-11 DIAGNOSIS — E1129 Type 2 diabetes mellitus with other diabetic kidney complication: Secondary | ICD-10-CM | POA: Diagnosis not present

## 2023-08-11 DIAGNOSIS — Z6828 Body mass index (BMI) 28.0-28.9, adult: Secondary | ICD-10-CM | POA: Diagnosis not present

## 2023-08-21 DIAGNOSIS — H43812 Vitreous degeneration, left eye: Secondary | ICD-10-CM | POA: Diagnosis not present

## 2023-08-21 DIAGNOSIS — H2513 Age-related nuclear cataract, bilateral: Secondary | ICD-10-CM | POA: Diagnosis not present

## 2023-08-21 DIAGNOSIS — H43811 Vitreous degeneration, right eye: Secondary | ICD-10-CM | POA: Diagnosis not present

## 2023-08-21 DIAGNOSIS — H353131 Nonexudative age-related macular degeneration, bilateral, early dry stage: Secondary | ICD-10-CM | POA: Diagnosis not present

## 2023-08-21 DIAGNOSIS — H33331 Multiple defects of retina without detachment, right eye: Secondary | ICD-10-CM | POA: Diagnosis not present

## 2023-08-25 ENCOUNTER — Other Ambulatory Visit: Payer: Self-pay | Admitting: Interventional Cardiology

## 2023-09-01 DIAGNOSIS — E782 Mixed hyperlipidemia: Secondary | ICD-10-CM | POA: Diagnosis not present

## 2023-09-10 ENCOUNTER — Telehealth: Payer: Self-pay | Admitting: Internal Medicine

## 2023-09-10 NOTE — Telephone Encounter (Signed)
 Shanda Bumps from [facility] called stating pt is scheduled to have CT there tomorrow but it's still showing pending auth....she'd like to know if the office is able to see if it was approved but just hadn't made it to their system yet. Please advise

## 2023-09-11 ENCOUNTER — Ambulatory Visit (INDEPENDENT_AMBULATORY_CARE_PROVIDER_SITE_OTHER)
Admission: RE | Admit: 2023-09-11 | Discharge: 2023-09-11 | Disposition: A | Discharge status: Home / Self Care | Source: Ambulatory Visit | Attending: Internal Medicine | Admitting: Internal Medicine

## 2023-09-11 DIAGNOSIS — R931 Abnormal findings on diagnostic imaging of heart and coronary circulation: Secondary | ICD-10-CM | POA: Diagnosis not present

## 2023-09-11 DIAGNOSIS — K449 Diaphragmatic hernia without obstruction or gangrene: Secondary | ICD-10-CM | POA: Diagnosis not present

## 2023-09-11 DIAGNOSIS — I77819 Aortic ectasia, unspecified site: Secondary | ICD-10-CM

## 2023-09-11 DIAGNOSIS — I700 Atherosclerosis of aorta: Secondary | ICD-10-CM | POA: Diagnosis not present

## 2023-09-11 DIAGNOSIS — K760 Fatty (change of) liver, not elsewhere classified: Secondary | ICD-10-CM | POA: Diagnosis not present

## 2023-09-11 DIAGNOSIS — I2510 Atherosclerotic heart disease of native coronary artery without angina pectoris: Secondary | ICD-10-CM | POA: Diagnosis not present

## 2023-09-11 MED ORDER — IOHEXOL 350 MG/ML SOLN
100.0000 mL | Freq: Once | INTRAVENOUS | Status: AC | PRN
  Administered 2023-09-11: 75 mL via INTRAVENOUS

## 2023-09-12 DIAGNOSIS — N1832 Chronic kidney disease, stage 3b: Secondary | ICD-10-CM | POA: Diagnosis not present

## 2023-09-12 DIAGNOSIS — Z6828 Body mass index (BMI) 28.0-28.9, adult: Secondary | ICD-10-CM | POA: Diagnosis not present

## 2023-09-26 ENCOUNTER — Telehealth: Payer: Self-pay | Admitting: Internal Medicine

## 2023-09-26 ENCOUNTER — Encounter: Payer: Self-pay | Admitting: Internal Medicine

## 2023-09-26 NOTE — Telephone Encounter (Signed)
 Called and spoke w the patient.  He had reviewed the results on mychart from Dr. Lynnette Caffey this morning and replied.  We reviewed all again.  No further questions or concerns.  He has follow up scheduled for July.

## 2023-09-26 NOTE — Telephone Encounter (Signed)
Patient would like to know the results of his CT

## 2023-10-02 DIAGNOSIS — N289 Disorder of kidney and ureter, unspecified: Secondary | ICD-10-CM | POA: Diagnosis not present

## 2023-10-02 DIAGNOSIS — C61 Malignant neoplasm of prostate: Secondary | ICD-10-CM | POA: Diagnosis not present

## 2023-10-02 DIAGNOSIS — E291 Testicular hypofunction: Secondary | ICD-10-CM | POA: Diagnosis not present

## 2023-10-21 ENCOUNTER — Other Ambulatory Visit: Payer: Self-pay | Admitting: Internal Medicine

## 2023-10-27 MED ORDER — TIRZEPATIDE-WEIGHT MANAGEMENT 7.5 MG/0.5ML ~~LOC~~ SOLN: 7.5000 mg | SUBCUTANEOUS | 2 refills | Status: DC

## 2023-10-27 NOTE — Addendum Note (Signed)
 Addended by: Ketrina Boateng D on: 10/27/2023 09:58 AM   Modules accepted: Orders

## 2023-11-19 DIAGNOSIS — M5459 Other low back pain: Secondary | ICD-10-CM | POA: Diagnosis not present

## 2023-11-28 DIAGNOSIS — M4316 Spondylolisthesis, lumbar region: Secondary | ICD-10-CM | POA: Diagnosis not present

## 2023-11-28 DIAGNOSIS — M791 Myalgia, unspecified site: Secondary | ICD-10-CM | POA: Diagnosis not present

## 2023-11-28 DIAGNOSIS — Z1330 Encounter for screening examination for mental health and behavioral disorders, unspecified: Secondary | ICD-10-CM | POA: Diagnosis not present

## 2023-11-28 DIAGNOSIS — M47816 Spondylosis without myelopathy or radiculopathy, lumbar region: Secondary | ICD-10-CM | POA: Diagnosis not present

## 2023-12-16 DIAGNOSIS — M5187 Other intervertebral disc disorders, lumbosacral region: Secondary | ICD-10-CM | POA: Diagnosis not present

## 2023-12-16 DIAGNOSIS — M4316 Spondylolisthesis, lumbar region: Secondary | ICD-10-CM | POA: Diagnosis not present

## 2023-12-16 DIAGNOSIS — M51369 Other intervertebral disc degeneration, lumbar region without mention of lumbar back pain or lower extremity pain: Secondary | ICD-10-CM | POA: Diagnosis not present

## 2023-12-16 DIAGNOSIS — M47816 Spondylosis without myelopathy or radiculopathy, lumbar region: Secondary | ICD-10-CM | POA: Diagnosis not present

## 2023-12-19 DIAGNOSIS — M47816 Spondylosis without myelopathy or radiculopathy, lumbar region: Secondary | ICD-10-CM | POA: Diagnosis not present

## 2023-12-30 ENCOUNTER — Other Ambulatory Visit: Payer: Self-pay | Admitting: Internal Medicine

## 2024-01-05 DIAGNOSIS — M4316 Spondylolisthesis, lumbar region: Secondary | ICD-10-CM | POA: Diagnosis not present

## 2024-01-05 DIAGNOSIS — M47816 Spondylosis without myelopathy or radiculopathy, lumbar region: Secondary | ICD-10-CM | POA: Diagnosis not present

## 2024-01-13 NOTE — Progress Notes (Unsigned)
 Cardiology Office Note:   Date:  01/19/2024  ID:  Alan Snyder, DOB 10/31/1945, MRN [id number] PCP:  Trinidad Glisson, MD  [facility] Providers Cardiologist:  Wendel Haws, MD Referring MD: Trinidad Glisson, MD  Chief Complaint/Reason for Referral: Follow-up for hypertension ASSESSMENT:    1. Type 2 diabetes mellitus with complication, without long-term current use of insulin  (HCC)   2. Hypertension associated with diabetes (HCC)   3. Hyperlipidemia associated with type 2 diabetes mellitus (HCC)   4. Aortic atherosclerosis (HCC)   5. CKD stage 3 due to type 2 diabetes mellitus (HCC)   6. Diastolic dysfunction   7. BMI 31.0-31.9,adult     PLAN:   In order of problems listed above: T2DM: Continue aspirin  81 mg, rosuvastatin  5 mg, valsartan 3 and 20 mg, and Farxiga  10 mg. Hypertension: Continue Bystolic  10 mg, spironolactone  25 mg, and valsartan 320 mg.  BP is well controlled.  Given lightheadedness in the mid morning we will have the patient shift his Bystolic , spironolactone , and valsartan to bedtime dosing.  If he continues to have symptoms we may need to reduce the dose of one of his medications. Hyperlipidemia: Continue Zetia  10 mg; check lipid panel and LP(a) today.   May need to restart Crestor . Aortic atherosclerosis: Continue aspirin  81 mg and Zetia  10 mg. CKD stage III: Continue valsartan 320 mg, Farxiga  10 mg both for renal protection. Diastolic dysfunction: Continue Bystolic  10 mg, Farxiga  10 mg, valsartan 320 mg, and spironolactone  25 mg. Elevated BMI: Continue Zepbound  7.5 mg every week.  He has lost 30 pounds and feels much improved.            Dispo:  Return in about 6 months (around 07/21/2024).       Labs/tests ordered: Orders Placed This Encounter  Procedures   Lipoprotein A (LPA)   Lipid panel   EKG 12-Lead    Current medicines are reviewed at length with the patient today.  The patient does not have concerns regarding medicines.  I spent 35  minutes reviewing all clinical data during and prior to this visit including all relevant imaging studies, laboratories, clinical information from other health systems and prior notes from both Cardiology and other specialties, interviewing the patient, conducting a complete physical examination, and coordinating care in order to formulate a comprehensive and personalized evaluation and treatment plan.   History of Present Illness:    FOCUSED PROBLEM LIST:   T2DM Not on insulin  Hypertension Hyperlipidemia Aortic atherosclerosis Chest CT 2025 Diastolic dysfunction G1 DD, no significant valve issues, EF 60 to 65% TTE September 2024 Aortic dilatation 41 mm, TTE 2024 Chest CT no aneurysm March 2025 CKD stage IIIb BMI 22 February 2023: The patient is a 78 y.o. male with the indicated medical history here for routine cardiology follow-up.  The patient was last seen by Dr. Dann  and July 2023.  At that point in time his blood pressure was not at goal.  He had reported some peripheral edema that seem to be responding to Lasix .  It had been thought to be related to amlodipine  usage in the past.  Amlodipine  5 mg was added back.  He was referred to the pharmacy team but unfortunately he did not see them.  It looks like his amlodipine  was stopped in the interim.   The patient is well.  His primary issues regard low back pain and hip pain and this has limited his ability to lose weight.  He denies any exertional angina or dyspnea.  He did feel lightheaded 1 day when he was sitting down.  This is associated with nausea.  He had some apple juice and this resolved.  He said no positional presyncope when going from sitting to standing.  He denies any signs or symptoms of stroke.  He has had no severe bleeding.  He has been tolerating his rosuvastatin  well without significant myalgias or arthralgias.  Plan: Obtain echocardiogram, refer to pharmacy for GLP 1 receptor agonist, start aspirin  81 mg.  July  2025:  Patient consents to use of AI scribe. In the interim the patient was started on Zepbound  by pharmacy.  Echocardiogram demonstrated grade 1 diastolic dysfunction with a preserved ejection fraction.  It did also show aortic dilatation.  A chest CT was performed which showed no aneurysm.  He experiences significant lightheadedness, particularly after exercising, which he attributes to his current blood pressure medication regimen. His blood pressure can drop to as low as 100/55 mmHg, causing him to feel 'a little queasy'. In the evenings, his blood pressure tends to be elevated, around 135/78 mmHg.  He has a history of hypertension and is currently taking Diovan HCT, Aldactone , and Bystolic , all in the morning. His primary care physician recently discontinued Norvasc  due to low blood pressure readings.  He has lost about 30 pounds since starting Zepbound , which has led to changes in his lifestyle, including going to the gym three times a week. He is concerned about muscular deterioration and is actively working to prevent it.  He has been on cholesterol management medications, including Crestor  and Zetia . His primary care physician recently advised him to stop Crestor  due to low cholesterol levels, and he is currently only taking Zetia . His cholesterol and triglyceride levels have been excellent, with the exception of one marker.     Current Medications: Current Meds  Medication Sig   aspirin  EC 81 MG tablet Take 1 tablet (81 mg total) by mouth daily. Swallow whole.   dapagliflozin  propanediol (FARXIGA ) 10 MG TABS tablet Take 1 tablet (10 mg total) by mouth daily before breakfast.   docusate sodium  (COLACE) 100 MG capsule Take 1 capsule (100 mg total) by mouth 2 (two) times daily.   ezetimibe  (ZETIA ) 10 MG tablet Take 10 mg by mouth daily.   fenofibrate  (TRICOR ) 145 MG tablet Take 145 mg by mouth daily.   gabapentin  (NEURONTIN ) 300 MG capsule Take 600-900 mg by mouth See admin instructions.  Take 600 mg by mouth in the morning and 900 mg at night   Multiple Vitamin (MULTIVITAMIN WITH MINERALS) TABS tablet Take 1 tablet by mouth daily.   nebivolol  (BYSTOLIC ) 10 MG tablet Take 10 mg by mouth at bedtime.   Omega-3 Fatty Acids (FISH OIL) 1000 MG CAPS Take 2,000 mg by mouth daily at 6 (six) AM.   traZODone  (DESYREL ) 50 MG tablet Take 50-150 mg by mouth at bedtime.   valsartan (DIOVAN) 320 MG tablet Take 320 mg by mouth at bedtime.   vitamin B-12 (CYANOCOBALAMIN) 1000 MCG tablet Take 1 tablet by mouth daily.   ZEPBOUND  7.5 MG/0.5ML injection vial INJECT 0.5 ML (7.5 MG) UNDER THE SKIN ONCE WEEKLY (0.5ML= 50 UNITS)   [DISCONTINUED] spironolactone  (ALDACTONE ) 25 MG tablet Take 1 tablet (25 mg total) by mouth daily.     Review of Systems:   Please see the history of present illness.    All other systems reviewed and are negative.     EKGs/Labs/Other Test Reviewed:   EKG: 2024 normal sinus rhythm, left axis deviation,  inferior infarction pattern  EKG Interpretation Date/Time:  Monday January 19 2024 10:52:22 EDT Ventricular Rate:  76 PR Interval:  184 QRS Duration:  92 QT Interval:  396 QTC Calculation: 445 R Axis:   -28  Text Interpretation: Normal sinus rhythm Normal ECG When compared with ECG of 19-Feb-2023 13:54, No significant change was found Confirmed by Wendel Haws ([id number]) on 01/19/2024 10:53:45 AM         Risk Assessment/Calculations:          Physical Exam:   VS:  BP 110/64   Pulse 77   Ht 6' (1.829 m)   Wt 200 lb (90.7 kg)   SpO2 97%   BMI 27.12 kg/m        Wt Readings from Last 3 Encounters:  01/19/24 200 lb (90.7 kg)  02/19/23 220 lb (99.8 kg)  12/27/21 233 lb (105.7 kg)      GENERAL:  No apparent distress, AOx3 HEENT:  No carotid bruits, +2 carotid impulses, no scleral icterus CAR: RRR no murmurs, gallops, rubs, or thrills RES:  Clear to auscultation bilaterally ABD:  Soft, nontender, nondistended, positive bowel sounds x 4 VASC:  +2 radial  pulses, +2 carotid pulses NEURO:  CN 2-12 grossly intact; motor and sensory grossly intact PSYCH:  No active depression or anxiety EXT:  No edema, ecchymosis, or cyanosis  Signed, Haws MARLA Wendel, MD  01/19/2024 11:40 AM    [facility] [address] [place], [place], [place]  [address] Phone: [phone]; Fax: [phone]   Note:  This document was prepared using Dragon voice recognition software and may include unintentional dictation errors.

## 2024-01-19 ENCOUNTER — Ambulatory Visit: Attending: Internal Medicine | Admitting: Internal Medicine

## 2024-01-19 ENCOUNTER — Other Ambulatory Visit: Payer: Self-pay

## 2024-01-19 ENCOUNTER — Encounter: Payer: Self-pay | Admitting: Internal Medicine

## 2024-01-19 VITALS — BP 110/64 | HR 77 | Ht 72.0 in | Wt 200.0 lb

## 2024-01-19 DIAGNOSIS — E1169 Type 2 diabetes mellitus with other specified complication: Secondary | ICD-10-CM | POA: Insufficient documentation

## 2024-01-19 DIAGNOSIS — I77819 Aortic ectasia, unspecified site: Secondary | ICD-10-CM

## 2024-01-19 DIAGNOSIS — I700 Atherosclerosis of aorta: Secondary | ICD-10-CM | POA: Diagnosis not present

## 2024-01-19 DIAGNOSIS — E785 Hyperlipidemia, unspecified: Secondary | ICD-10-CM | POA: Insufficient documentation

## 2024-01-19 DIAGNOSIS — Z6831 Body mass index (BMI) 31.0-31.9, adult: Secondary | ICD-10-CM | POA: Insufficient documentation

## 2024-01-19 DIAGNOSIS — E1122 Type 2 diabetes mellitus with diabetic chronic kidney disease: Secondary | ICD-10-CM | POA: Insufficient documentation

## 2024-01-19 DIAGNOSIS — E118 Type 2 diabetes mellitus with unspecified complications: Secondary | ICD-10-CM | POA: Insufficient documentation

## 2024-01-19 DIAGNOSIS — N183 Chronic kidney disease, stage 3 unspecified: Secondary | ICD-10-CM | POA: Insufficient documentation

## 2024-01-19 DIAGNOSIS — I152 Hypertension secondary to endocrine disorders: Secondary | ICD-10-CM | POA: Insufficient documentation

## 2024-01-19 DIAGNOSIS — E1159 Type 2 diabetes mellitus with other circulatory complications: Secondary | ICD-10-CM | POA: Diagnosis not present

## 2024-01-19 DIAGNOSIS — I5189 Other ill-defined heart diseases: Secondary | ICD-10-CM | POA: Diagnosis not present

## 2024-01-19 MED ORDER — SPIRONOLACTONE 25 MG PO TABS: 25.0000 mg | ORAL_TABLET | Freq: Every day | ORAL | 3 refills | Status: AC

## 2024-01-19 NOTE — Patient Instructions (Signed)
 Medication Instructions:  Your physician has recommended you make the following change in your medication:  Change Bystolic , valsartan and spironolactone  to bedtime  *If you need a refill on your cardiac medications before your next appointment, please call your pharmacy*  Lab Work: Today: go to first floor lab for blood work (lipids and Lpa)  If you have labs (blood work) drawn today and your tests are completely normal, you will receive your results only by: MyChart Message (if you have MyChart) OR A paper copy in the mail If you have any lab test that is abnormal or we need to change your treatment, we will call you to review the results.  Testing/Procedures: none  Follow-Up: At [facility], you and your health needs are our priority.  As part of our continuing mission to provide you with exceptional heart care, our providers are all part of one team.  This team includes your primary Cardiologist (physician) and Advanced Practice Providers or APPs (Physician Assistants and Nurse Practitioners) who all work together to provide you with the care you need, when you need it.  Your next appointment:   6 month(s)  Provider:   Arun Thukkani, MD

## 2024-01-20 ENCOUNTER — Ambulatory Visit: Payer: Self-pay | Admitting: Internal Medicine

## 2024-01-20 DIAGNOSIS — E1169 Type 2 diabetes mellitus with other specified complication: Secondary | ICD-10-CM

## 2024-01-20 LAB — BASIC METABOLIC PANEL WITH GFR
BUN/Creatinine Ratio: 13 (ref 10–24)
BUN: 19 mg/dL (ref 8–27)
CO2: 20 mmol/L (ref 20–29)
Calcium: 9.5 mg/dL (ref 8.6–10.2)
Chloride: 102 mmol/L (ref 96–106)
Creatinine, Ser: 1.45 mg/dL — ABNORMAL HIGH (ref 0.76–1.27)
Glucose: 82 mg/dL (ref 70–99)
Potassium: 4.6 mmol/L (ref 3.5–5.2)
Sodium: 140 mmol/L (ref 134–144)
eGFR: 49 mL/min/1.73 — ABNORMAL LOW (ref >59)

## 2024-01-20 LAB — LIPID PANEL
Chol/HDL Ratio: 3.8 ratio (ref 0.0–5.0)
Cholesterol, Total: 145 mg/dL (ref 100–199)
HDL: 38 mg/dL — ABNORMAL LOW (ref >39)
LDL Chol Calc (NIH): 81 mg/dL (ref 0–99)
Triglycerides: 148 mg/dL (ref 0–149)
VLDL Cholesterol Cal: 26 mg/dL (ref 5–40)

## 2024-01-20 LAB — LIPOPROTEIN A (LPA): Lipoprotein (a): <8.4 nmol/L (ref <75.0)

## 2024-01-23 ENCOUNTER — Other Ambulatory Visit: Payer: Self-pay | Admitting: *Deleted

## 2024-01-23 DIAGNOSIS — E1169 Type 2 diabetes mellitus with other specified complication: Secondary | ICD-10-CM

## 2024-01-23 MED ORDER — ROSUVASTATIN CALCIUM 5 MG PO TABS: 5.0000 mg | ORAL_TABLET | Freq: Every day | ORAL | 3 refills | Status: AC

## 2024-01-27 DIAGNOSIS — M4316 Spondylolisthesis, lumbar region: Secondary | ICD-10-CM | POA: Diagnosis not present

## 2024-01-27 DIAGNOSIS — M5416 Radiculopathy, lumbar region: Secondary | ICD-10-CM | POA: Diagnosis not present

## 2024-02-02 DIAGNOSIS — E782 Mixed hyperlipidemia: Secondary | ICD-10-CM | POA: Diagnosis not present

## 2024-02-02 DIAGNOSIS — E1129 Type 2 diabetes mellitus with other diabetic kidney complication: Secondary | ICD-10-CM | POA: Diagnosis not present

## 2024-02-09 DIAGNOSIS — Z1389 Encounter for screening for other disorder: Secondary | ICD-10-CM | POA: Diagnosis not present

## 2024-02-09 DIAGNOSIS — Z1339 Encounter for screening examination for other mental health and behavioral disorders: Secondary | ICD-10-CM | POA: Diagnosis not present

## 2024-02-09 DIAGNOSIS — Z1331 Encounter for screening for depression: Secondary | ICD-10-CM | POA: Diagnosis not present

## 2024-02-09 DIAGNOSIS — Z136 Encounter for screening for cardiovascular disorders: Secondary | ICD-10-CM | POA: Diagnosis not present

## 2024-02-09 DIAGNOSIS — Z139 Encounter for screening, unspecified: Secondary | ICD-10-CM | POA: Diagnosis not present

## 2024-02-09 DIAGNOSIS — R413 Other amnesia: Secondary | ICD-10-CM | POA: Diagnosis not present

## 2024-02-09 DIAGNOSIS — E1129 Type 2 diabetes mellitus with other diabetic kidney complication: Secondary | ICD-10-CM | POA: Diagnosis not present

## 2024-02-09 DIAGNOSIS — E782 Mixed hyperlipidemia: Secondary | ICD-10-CM | POA: Diagnosis not present

## 2024-02-09 DIAGNOSIS — Z6827 Body mass index (BMI) 27.0-27.9, adult: Secondary | ICD-10-CM | POA: Diagnosis not present

## 2024-02-09 DIAGNOSIS — Z0000 Encounter for general adult medical examination without abnormal findings: Secondary | ICD-10-CM | POA: Diagnosis not present

## 2024-02-09 DIAGNOSIS — I959 Hypotension, unspecified: Secondary | ICD-10-CM | POA: Diagnosis not present

## 2024-02-17 DIAGNOSIS — M5416 Radiculopathy, lumbar region: Secondary | ICD-10-CM | POA: Diagnosis not present

## 2024-02-24 DIAGNOSIS — E1129 Type 2 diabetes mellitus with other diabetic kidney complication: Secondary | ICD-10-CM | POA: Diagnosis not present

## 2024-03-01 DIAGNOSIS — Z6827 Body mass index (BMI) 27.0-27.9, adult: Secondary | ICD-10-CM | POA: Diagnosis not present

## 2024-03-01 DIAGNOSIS — E1129 Type 2 diabetes mellitus with other diabetic kidney complication: Secondary | ICD-10-CM | POA: Diagnosis not present

## 2024-03-01 DIAGNOSIS — N1832 Chronic kidney disease, stage 3b: Secondary | ICD-10-CM | POA: Diagnosis not present

## 2024-03-02 DIAGNOSIS — M5416 Radiculopathy, lumbar region: Secondary | ICD-10-CM | POA: Diagnosis not present

## 2024-03-08 ENCOUNTER — Other Ambulatory Visit: Payer: Self-pay | Admitting: Interventional Cardiology

## 2024-03-10 ENCOUNTER — Other Ambulatory Visit: Payer: Self-pay | Admitting: Internal Medicine

## 2024-04-07 DIAGNOSIS — C61 Malignant neoplasm of prostate: Secondary | ICD-10-CM | POA: Diagnosis not present

## 2024-04-07 DIAGNOSIS — N289 Disorder of kidney and ureter, unspecified: Secondary | ICD-10-CM | POA: Diagnosis not present

## 2024-04-07 DIAGNOSIS — E291 Testicular hypofunction: Secondary | ICD-10-CM | POA: Diagnosis not present

## 2024-04-14 DIAGNOSIS — Z23 Encounter for immunization: Secondary | ICD-10-CM | POA: Diagnosis not present

## 2024-04-26 ENCOUNTER — Encounter: Payer: Self-pay | Admitting: Radiology

## 2024-07-01 ENCOUNTER — Encounter: Payer: Self-pay | Admitting: Internal Medicine

## 2024-07-22 NOTE — Progress Notes (Signed)
 "  Cardiology Office Note:   Date:  07/28/2024  ID:  Alan Snyder, DOB [birth year], MRN [id number] PCP:  Trinidad Glisson, MD  [facility] Providers Cardiologist:  Wendel Haws, MD Referring MD: Trinidad Glisson, MD  Chief Complaint/Reason for Referral: Follow-up for hypertension ASSESSMENT:    1. Type 2 diabetes mellitus with complication, without long-term current use of insulin  (HCC)   2. Hypertension associated with diabetes (HCC)   3. Hyperlipidemia associated with type 2 diabetes mellitus (HCC)   4. Aortic atherosclerosis   5. CKD stage 3 due to type 2 diabetes mellitus (HCC)   6. Diastolic dysfunction   7. BMI 31.0-31.9,adult      PLAN:   In order of problems listed above: T2DM: Continue aspirin  81 mg, Crestor  5 mg, valsartan 320 mg, and Farxiga  10 mg. Hypertension: Continue Bystolic  10 mg, spironolactone  25 mg, and amlodipine  5 mg.  BP well controlled.   Hyperlipidemia: Continue Crestor  5 mg.  LDL in August 2025 was 53. Aortic atherosclerosis: Continue aspirin  81 mg and Crestor  5 mg. CKD stage III: Continue Farxiga  10 mg for renal protection. Diastolic dysfunction: Continue Bystolic  10 mg, Farxiga  10 mg and spironolactone  25 mg. Elevated BMI: Continue Zepbound  7.5 mg every week.              Dispo:  Return in about 1 year (around 07/28/2025).       Labs/tests ordered: No orders of the defined types were placed in this encounter.   Current medicines are reviewed at length with the patient today.  The patient does not have concerns regarding medicines.  I spent 35 minutes reviewing all clinical data during and prior to this visit including all relevant imaging studies, laboratories, clinical information from other health systems and prior notes from both Cardiology and other specialties, interviewing the patient, conducting a complete physical examination, and coordinating care in order to formulate a comprehensive and personalized evaluation and treatment  plan.   History of Present Illness:    FOCUSED PROBLEM LIST:   T2DM Not on insulin  Hypertension Hyperlipidemia LP(a) less than 8.4 Aortic atherosclerosis Chest CT 2025 Diastolic dysfunction G1 DD, no significant valve issues, EF 60 to 65% TTE September 2024 Aortic dilatation 41 mm, TTE 2024 Chest CT no aneurysm March 2025 CKD stage IIIb BMI 22 February 2023: The patient is a 79 y.o. male with the indicated medical history here for routine cardiology follow-up.  The patient was last seen by Dr. Dann  and July 2023.  At that point in time his blood pressure was not at goal.  He had reported some peripheral edema that seem to be responding to Lasix .  It had been thought to be related to amlodipine  usage in the past.  Amlodipine  5 mg was added back.  He was referred to the pharmacy team but unfortunately he did not see them.  It looks like his amlodipine  was stopped in the interim.   The patient is well.  His primary issues regard low back pain and hip pain and this has limited his ability to lose weight.  He denies any exertional angina or dyspnea.  He did feel lightheaded 1 day when he was sitting down.  This is associated with nausea.  He had some apple juice and this resolved.  He said no positional presyncope when going from sitting to standing.  He denies any signs or symptoms of stroke.  He has had no severe bleeding.  He has been tolerating his rosuvastatin  well without significant  myalgias or arthralgias.  Plan: Obtain echocardiogram, refer to pharmacy for GLP 1 receptor agonist, start aspirin  81 mg.  July 2025:  Patient consents to use of AI scribe. In the interim the patient was started on Zepbound  by pharmacy.  Echocardiogram demonstrated grade 1 diastolic dysfunction with a preserved ejection fraction.  It did also show aortic dilatation.  A chest CT was performed which showed no aneurysm.  He experiences significant lightheadedness, particularly after exercising, which he  attributes to his current blood pressure medication regimen. His blood pressure can drop to as low as 100/55 mmHg, causing him to feel 'a little queasy'. In the evenings, his blood pressure tends to be elevated, around 135/78 mmHg.  He has a history of hypertension and is currently taking Diovan HCT, Aldactone , and Bystolic , all in the morning. His primary care physician recently discontinued Norvasc  due to low blood pressure readings.  He has lost about 30 pounds since starting Zepbound , which has led to changes in his lifestyle, including going to the gym three times a week. He is concerned about muscular deterioration and is actively working to prevent it.  He has been on cholesterol management medications, including Crestor  and Zetia . His primary care physician recently advised him to stop Crestor  due to low cholesterol levels, and he is currently only taking Zetia . His cholesterol and triglyceride levels have been excellent, with the exception of one marker.  Plan: Check lipid panel and LP(a).  February 2026:  Patient consents to use of AI scribe. In the interim the patient's LP(a) was low.  The patient's LDL was 81.  Crestor  10 mg was started.  He discontinued valsartan last spring due to experiencing faintness, particularly after swimming or gym workouts. Since stopping valsartan, he notes an improvement in his GFR. He experienced near-fainting towards the end of his workouts before stopping valsartan.  He is currently taking Farxiga , which he believes is beneficial for his kidneys. He reports a significant weight loss of about 20-24 pounds, which he attributes to his current medication regimen, including Zepbound . He swims a mile and a half three times a week and goes to the gym three days a week.  He was prescribed Aldactone  for leg swelling, which has since resolved. He does not take Aldactone  regularly as he has not experienced swelling recently. He is also on Bystolic  and has been prescribed  spironolactone .  No chest pain, breathing difficulties, or current leg swelling.     Current Medications: Current Meds  Medication Sig   amLODipine  (NORVASC ) 5 MG tablet TAKE ONE TABLET BY MOUTH DAILY, increase to 2 tablets DAILY if BLOOD PRESSURE continues to be elevated   aspirin  EC 81 MG tablet Take 1 tablet (81 mg total) by mouth daily. Swallow whole.   dapagliflozin  propanediol (FARXIGA ) 10 MG TABS tablet Take 1 tablet (10 mg total) by mouth daily before breakfast.   docusate sodium  (COLACE) 100 MG capsule Take 1 capsule (100 mg total) by mouth 2 (two) times daily.   fenofibrate  (TRICOR ) 145 MG tablet Take 145 mg by mouth daily.   gabapentin  (NEURONTIN ) 300 MG capsule Take 600-900 mg by mouth See admin instructions. Take 600 mg by mouth in the morning and 900 mg at night   Multiple Vitamin (MULTIVITAMIN WITH MINERALS) TABS tablet Take 1 tablet by mouth daily.   nebivolol  (BYSTOLIC ) 10 MG tablet Take 10 mg by mouth at bedtime.   Omega-3 Fatty Acids (FISH OIL) 1000 MG CAPS Take 2,000 mg by mouth daily at 6 (six)  AM.   rosuvastatin  (CRESTOR ) 5 MG tablet Take 1 tablet (5 mg total) by mouth daily.   spironolactone  (ALDACTONE ) 25 MG tablet Take 1 tablet (25 mg total) by mouth at bedtime.   tirzepatide  (ZEPBOUND ) 7.5 MG/0.5ML injection vial INJECT 0.5 ML (7.5 MG) UNDER THE SKIN ONCE WEEKLY (0.5ML= 50 UNITS)   traZODone  (DESYREL ) 50 MG tablet Take 50-150 mg by mouth at bedtime.   vitamin B-12 (CYANOCOBALAMIN) 1000 MCG tablet Take 1 tablet by mouth daily.     Review of Systems:   Please see the history of present illness.    All other systems reviewed and are negative.     EKGs/Labs/Other Test Reviewed:   EKG: July 2025 normal sinus rhythm  EKG Interpretation Date/Time:    Ventricular Rate:    PR Interval:    QRS Duration:    QT Interval:    QTC Calculation:   R Axis:      Text Interpretation:           Risk Assessment/Calculations:          Physical Exam:   VS:  BP  110/62 (BP Location: Right Arm, Patient Position: Sitting, Cuff Size: Normal)   Pulse 70   Ht 6' (1.829 m)   Wt 194 lb 12.8 oz (88.4 kg)   SpO2 97%   BMI 26.42 kg/m        Wt Readings from Last 3 Encounters:  07/28/24 194 lb 12.8 oz (88.4 kg)  01/19/24 200 lb (90.7 kg)  02/19/23 220 lb (99.8 kg)      GENERAL:  No apparent distress, AOx3 HEENT:  No carotid bruits, +2 carotid impulses, no scleral icterus CAR: RRR no murmurs, gallops, rubs, or thrills RES:  Clear to auscultation bilaterally ABD:  Soft, nontender, nondistended, positive bowel sounds x 4 VASC:  +2 radial pulses, +2 carotid pulses NEURO:  CN 2-12 grossly intact; motor and sensory grossly intact PSYCH:  No active depression or anxiety EXT:  No edema, ecchymosis, or cyanosis  Signed, Marten Iles K Shaquina Gillham, MD  07/28/2024 8:46 AM    [facility] [address] [place], [place], [place]  [address] Phone: [phone]; Fax: [phone]   Note:  This document was prepared using Dragon voice recognition software and may include unintentional dictation errors. "

## 2024-07-28 ENCOUNTER — Encounter: Payer: Self-pay | Admitting: Internal Medicine

## 2024-07-28 ENCOUNTER — Ambulatory Visit: Admitting: Internal Medicine

## 2024-07-28 ENCOUNTER — Telehealth: Payer: Self-pay | Admitting: Pharmacist

## 2024-07-28 VITALS — BP 110/62 | HR 70 | Ht 72.0 in | Wt 194.8 lb

## 2024-07-28 DIAGNOSIS — I152 Hypertension secondary to endocrine disorders: Secondary | ICD-10-CM | POA: Diagnosis not present

## 2024-07-28 DIAGNOSIS — I5189 Other ill-defined heart diseases: Secondary | ICD-10-CM

## 2024-07-28 DIAGNOSIS — E118 Type 2 diabetes mellitus with unspecified complications: Secondary | ICD-10-CM | POA: Diagnosis not present

## 2024-07-28 DIAGNOSIS — E1169 Type 2 diabetes mellitus with other specified complication: Secondary | ICD-10-CM | POA: Diagnosis not present

## 2024-07-28 DIAGNOSIS — I700 Atherosclerosis of aorta: Secondary | ICD-10-CM | POA: Diagnosis not present

## 2024-07-28 DIAGNOSIS — N183 Chronic kidney disease, stage 3 unspecified: Secondary | ICD-10-CM | POA: Diagnosis not present

## 2024-07-28 DIAGNOSIS — E1159 Type 2 diabetes mellitus with other circulatory complications: Secondary | ICD-10-CM

## 2024-07-28 DIAGNOSIS — E785 Hyperlipidemia, unspecified: Secondary | ICD-10-CM

## 2024-07-28 DIAGNOSIS — Z6831 Body mass index (BMI) 31.0-31.9, adult: Secondary | ICD-10-CM

## 2024-07-28 DIAGNOSIS — E1122 Type 2 diabetes mellitus with diabetic chronic kidney disease: Secondary | ICD-10-CM

## 2024-07-28 MED ORDER — ZEPBOUND 10 MG/0.5ML ~~LOC~~ SOAJ: 10.0000 mg | SUBCUTANEOUS | 2 refills | Status: AC

## 2024-07-28 NOTE — Patient Instructions (Signed)
" ° °  Follow-Up: At [facility], you and your health needs are our priority.  As part of our continuing mission to provide you with exceptional heart care, our providers are all part of one team.  This team includes your primary Cardiologist (physician) and Advanced Practice Providers or APPs (Physician Assistants and Nurse Practitioners) who all work together to provide you with the care you need, when you need it.  Your next appointment:   1 year(s)  Provider:   One of our Advanced Practice Providers (APPs): Morse Clause, PA-C  Hanh Waddell Daniels, PA-C  Saddie Cleaves, NP  Olivia Pavy, PA-C Miriam Shams, NP  Leontine Salen, PA-C Josefa Beauvais, NP  [facility], PA-C [place], PA-C  [place], PA-C [place], [place]  Damien Braver, NP Jon Hails, PA-C  Waddell Donath, PA-C Dayna Dunn, PA-C  [place], PA-C [place], [place] Glendia Ferrier, PA-C Callie Goodrich, PA-C  Katlyn West, NP Thom Sluder, PA-C  Alyssa White, NP Rollo Louder, PA-C Xika Zhao, NP    Lamarr Satterfield, NP            We recommend signing up for the patient portal called MyChart.  Sign up information is provided on this After Visit Summary.  MyChart is used to connect with patients for Virtual Visits (Telemedicine).  Patients are able to view lab/test results, encounter notes, upcoming appointments, etc.  Non-urgent messages can be sent to your provider as well.   To learn more about what you can do with MyChart, go to [email or web address].   Other Instructions          "

## 2024-07-28 NOTE — Telephone Encounter (Signed)
 Spoke with patient today. Zepbound  dose increased to 10 mg once weekly. Patient has tolerated the 7.5 mg dose well without adverse effects. Reports going to the gym 3 days per week and swimming on the other 3 days as part of her exercise routine. Pt to call us  back in 2 months for further dose titration
